# Patient Record
Sex: Female | Born: 1945 | Race: White | Hispanic: No | Marital: Married | Smoking: Never smoker
Health system: Southern US, Community
[De-identification: ages and names within clinical notes are randomized; demographics above are authoritative.]

## PROBLEM LIST (undated history)

## (undated) DIAGNOSIS — E669 Obesity, unspecified: Secondary | ICD-10-CM

## (undated) DIAGNOSIS — N189 Chronic kidney disease, unspecified: Secondary | ICD-10-CM

## (undated) DIAGNOSIS — I10 Essential (primary) hypertension: Secondary | ICD-10-CM

## (undated) DIAGNOSIS — G4730 Sleep apnea, unspecified: Secondary | ICD-10-CM

## (undated) HISTORY — PX: APPENDECTOMY: SHX54

## (undated) HISTORY — DX: Chronic kidney disease, unspecified: N18.9

## (undated) HISTORY — PX: COSMETIC SURGERY: SHX468

## (undated) HISTORY — DX: Obesity, unspecified: E66.9

## (undated) HISTORY — PX: EYE SURGERY: SHX253

## (undated) HISTORY — PX: KNEE SURGERY: SHX244

## (undated) HISTORY — PX: CHOLECYSTECTOMY: SHX55

## (undated) HISTORY — DX: Sleep apnea, unspecified: G47.30

---

## 1998-09-17 ENCOUNTER — Other Ambulatory Visit: Admission: RE | Admit: 1998-09-17 | Discharge: 1998-09-17 | Payer: Self-pay | Admitting: Obstetrics and Gynecology

## 1998-10-06 ENCOUNTER — Encounter: Admission: RE | Admit: 1998-10-06 | Discharge: 1999-01-04 | Payer: Self-pay

## 1999-01-25 ENCOUNTER — Other Ambulatory Visit: Admission: RE | Admit: 1999-01-25 | Discharge: 1999-01-25 | Payer: Self-pay | Admitting: Obstetrics and Gynecology

## 2000-01-28 ENCOUNTER — Other Ambulatory Visit: Admission: RE | Admit: 2000-01-28 | Discharge: 2000-01-28 | Payer: Self-pay | Admitting: Obstetrics and Gynecology

## 2001-04-26 ENCOUNTER — Other Ambulatory Visit: Admission: RE | Admit: 2001-04-26 | Discharge: 2001-04-26 | Payer: Self-pay | Admitting: Obstetrics and Gynecology

## 2002-02-01 ENCOUNTER — Encounter: Payer: Self-pay | Admitting: Internal Medicine

## 2002-02-01 ENCOUNTER — Encounter: Admission: RE | Admit: 2002-02-01 | Discharge: 2002-02-01 | Payer: Self-pay | Admitting: Internal Medicine

## 2002-04-04 ENCOUNTER — Encounter: Payer: Self-pay | Admitting: General Surgery

## 2002-04-08 ENCOUNTER — Encounter: Payer: Self-pay | Admitting: General Surgery

## 2002-04-08 ENCOUNTER — Observation Stay (HOSPITAL_COMMUNITY): Admission: RE | Admit: 2002-04-08 | Discharge: 2002-04-09 | Payer: Self-pay | Admitting: General Surgery

## 2002-05-06 ENCOUNTER — Other Ambulatory Visit: Admission: RE | Admit: 2002-05-06 | Discharge: 2002-05-06 | Payer: Self-pay | Admitting: Obstetrics and Gynecology

## 2003-05-12 ENCOUNTER — Other Ambulatory Visit: Admission: RE | Admit: 2003-05-12 | Discharge: 2003-05-12 | Payer: Self-pay | Admitting: Obstetrics and Gynecology

## 2004-08-05 ENCOUNTER — Other Ambulatory Visit: Admission: RE | Admit: 2004-08-05 | Discharge: 2004-08-05 | Payer: Self-pay | Admitting: Obstetrics and Gynecology

## 2005-03-25 ENCOUNTER — Encounter: Admission: RE | Admit: 2005-03-25 | Discharge: 2005-04-20 | Payer: Self-pay | Admitting: Internal Medicine

## 2005-08-30 ENCOUNTER — Other Ambulatory Visit: Admission: RE | Admit: 2005-08-30 | Discharge: 2005-08-30 | Payer: Self-pay | Admitting: Obstetrics and Gynecology

## 2006-01-23 ENCOUNTER — Inpatient Hospital Stay (HOSPITAL_COMMUNITY): Admission: RE | Admit: 2006-01-23 | Discharge: 2006-01-27 | Payer: Self-pay | Admitting: Orthopedic Surgery

## 2006-01-24 ENCOUNTER — Ambulatory Visit: Payer: Self-pay | Admitting: Physical Medicine & Rehabilitation

## 2006-01-27 ENCOUNTER — Inpatient Hospital Stay (HOSPITAL_COMMUNITY)
Admission: RE | Admit: 2006-01-27 | Discharge: 2006-02-02 | Payer: Self-pay | Admitting: Physical Medicine & Rehabilitation

## 2006-01-27 ENCOUNTER — Ambulatory Visit: Payer: Self-pay | Admitting: Physical Medicine & Rehabilitation

## 2006-10-12 ENCOUNTER — Other Ambulatory Visit: Admission: RE | Admit: 2006-10-12 | Discharge: 2006-10-12 | Payer: Self-pay | Admitting: Obstetrics and Gynecology

## 2007-03-12 IMAGING — CR DG CHEST 2V
2 series · 2 of 2 positions shown · non-contrast
Comparison: None.

CLINICAL DATA: Preop radiograph.  Osteoarthritis both knees. 
 CHEST - 2 VIEW:

[w chest pa]
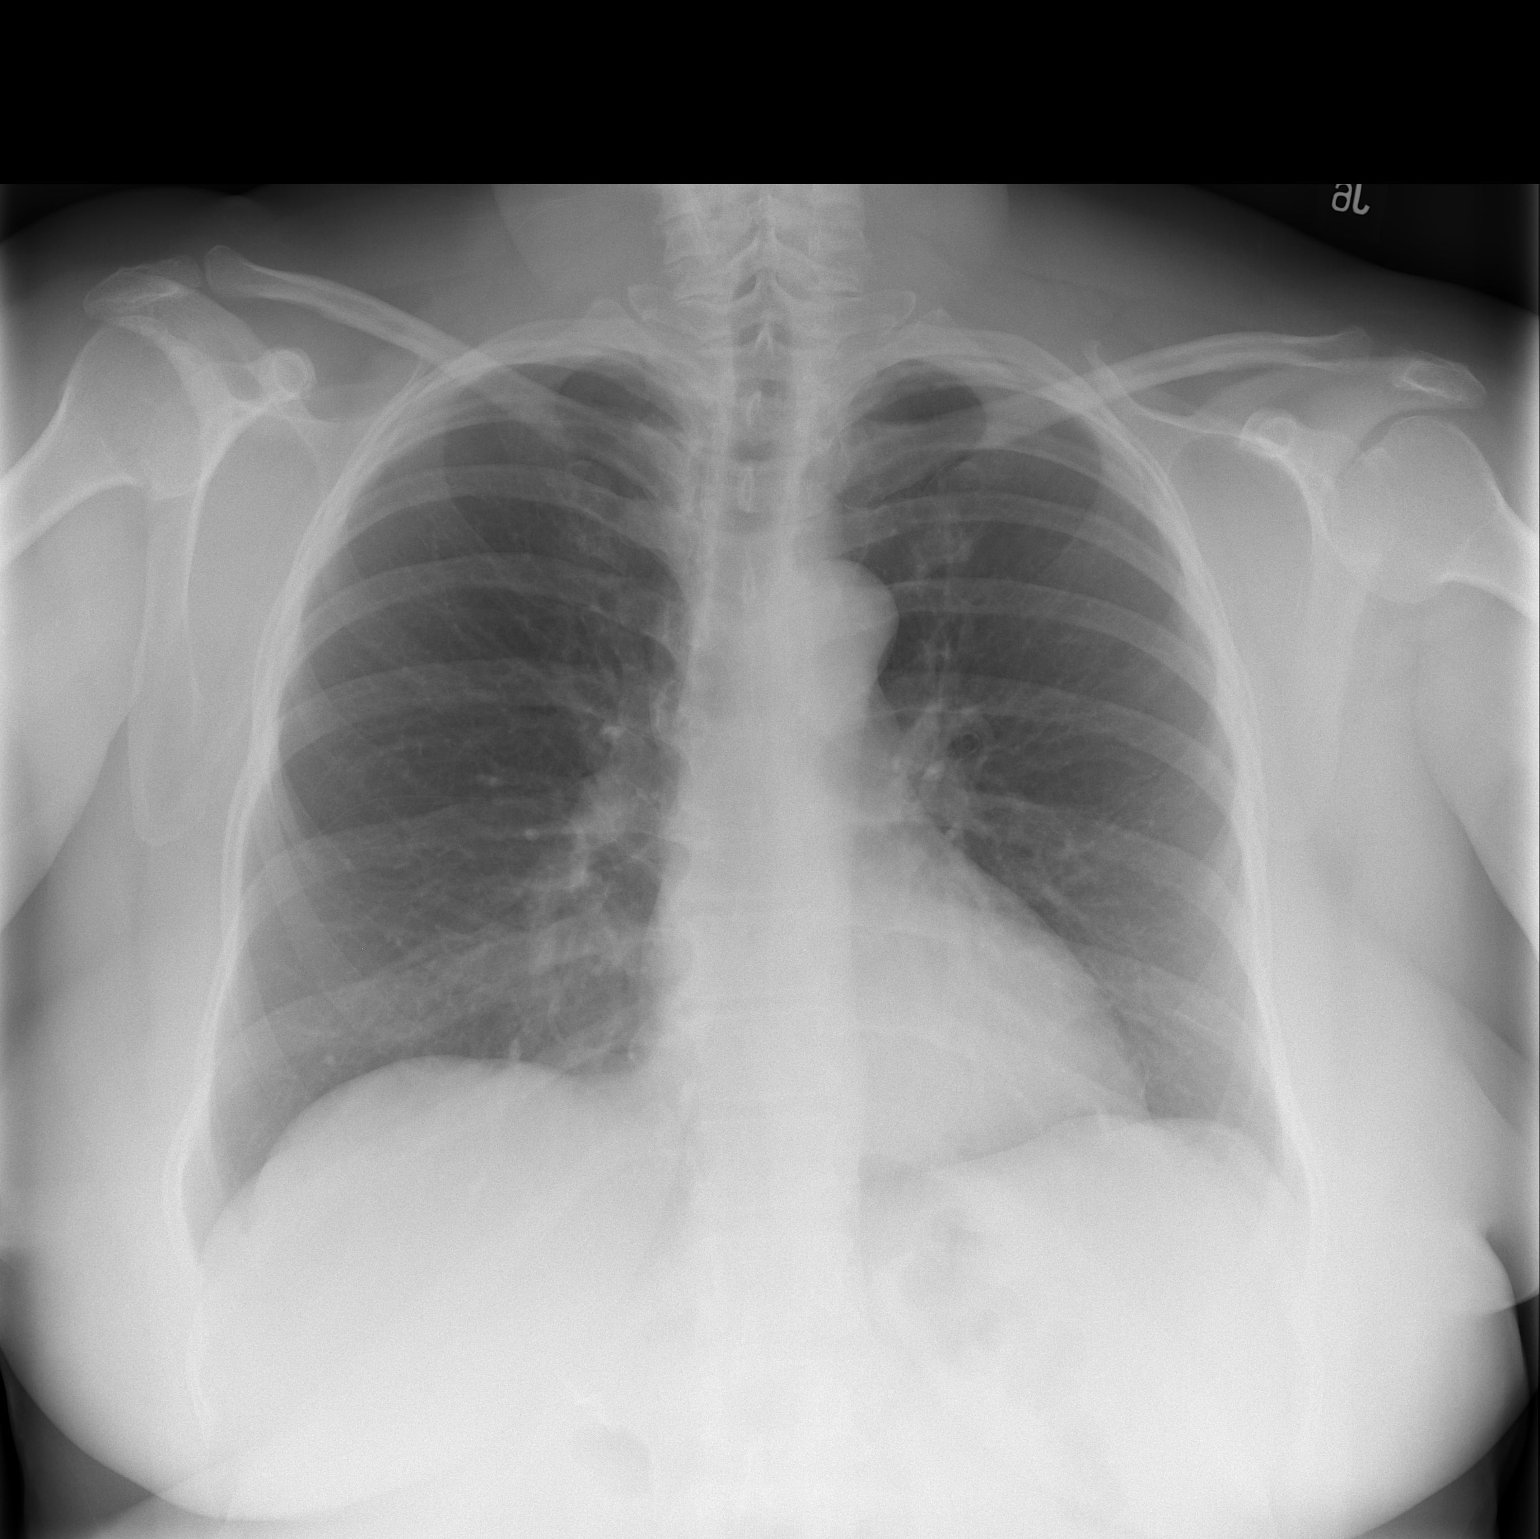

[w chest lat]
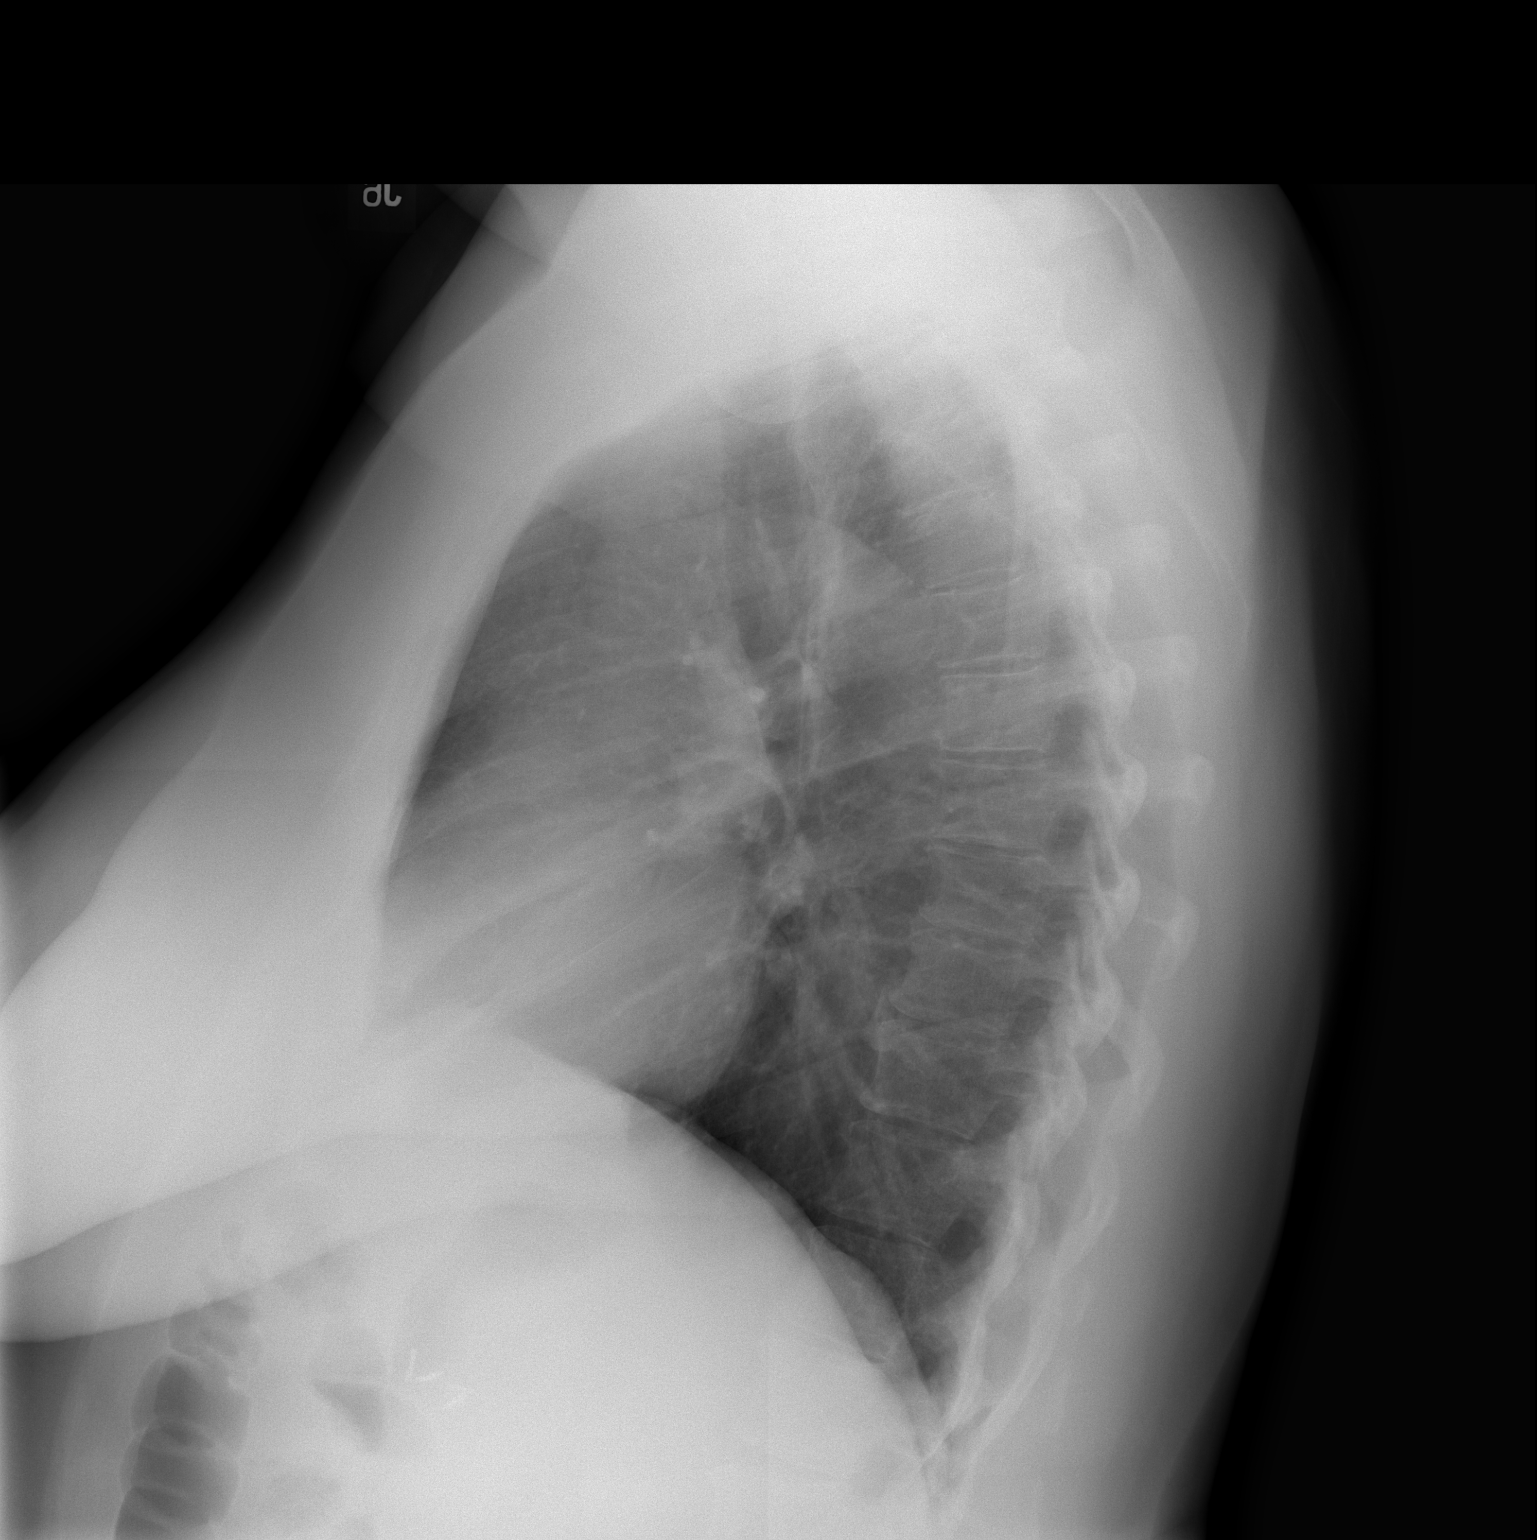

[2 of 2 positions shown; findings below may reference images not displayed]

FINDINGS: The heart size is normal.  There are no effusions or edema.
IMPRESSION: No active cardiopulmonary disease.

## 2008-02-19 ENCOUNTER — Other Ambulatory Visit: Admission: RE | Admit: 2008-02-19 | Discharge: 2008-02-19 | Payer: Self-pay | Admitting: Obstetrics and Gynecology

## 2008-10-14 ENCOUNTER — Ambulatory Visit: Payer: Self-pay | Admitting: Family Medicine

## 2008-10-28 ENCOUNTER — Ambulatory Visit: Payer: Self-pay | Admitting: Family Medicine

## 2008-11-04 ENCOUNTER — Ambulatory Visit: Payer: Self-pay | Admitting: Family Medicine

## 2008-11-11 ENCOUNTER — Ambulatory Visit: Payer: Self-pay | Admitting: Family Medicine

## 2008-12-02 ENCOUNTER — Ambulatory Visit: Payer: Self-pay | Admitting: Family Medicine

## 2008-12-09 ENCOUNTER — Ambulatory Visit: Payer: Self-pay | Admitting: Family Medicine

## 2008-12-16 ENCOUNTER — Ambulatory Visit: Payer: Self-pay | Admitting: Family Medicine

## 2008-12-23 ENCOUNTER — Ambulatory Visit: Payer: Self-pay | Admitting: Family Medicine

## 2009-01-06 ENCOUNTER — Ambulatory Visit: Payer: Self-pay | Admitting: Family Medicine

## 2009-02-03 ENCOUNTER — Ambulatory Visit: Payer: Self-pay | Admitting: Family Medicine

## 2009-02-10 ENCOUNTER — Ambulatory Visit: Payer: Self-pay | Admitting: Family Medicine

## 2009-02-23 ENCOUNTER — Other Ambulatory Visit: Admission: RE | Admit: 2009-02-23 | Discharge: 2009-02-23 | Payer: Self-pay | Admitting: Obstetrics and Gynecology

## 2009-03-03 ENCOUNTER — Ambulatory Visit: Payer: Self-pay | Admitting: Family Medicine

## 2010-08-25 ENCOUNTER — Other Ambulatory Visit: Admission: RE | Admit: 2010-08-25 | Discharge: 2010-08-25 | Payer: Self-pay | Admitting: Obstetrics and Gynecology

## 2011-04-15 NOTE — H&P (Signed)
NAMEJAYDI, [name]               ACCOUNT NO.:  [id number]   MEDICAL RECORD NO.:  [id number]          PATIENT TYPE:  INP   LOCATION:  B147                         FACILITY:  [facility]   PHYSICIAN:  Gaynelle Arabian, M.D.    DATE OF BIRTH:  04-Aug-1946   DATE OF ADMISSION:  01/23/2006  DATE OF DISCHARGE:                                HISTORY & PHYSICAL   CHIEF COMPLAINT:  Bilateral knee pain.   HISTORY OF PRESENT ILLNESS:  The patient is a 65 year old female who has  been seen by Dr. Gaynelle Arabian for ongoing bilateral knee pain.  Her knees  have reached the point where she is having pain with them just about all the  time now. 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  She is at  a point now where she would like to get her knees replaced.  She is seen in  the office where x-rays show bone on bone, actually worse on the right than  the left.  She does have end-stage arthritis and she would like to have both  knees done at the same time.  The advantages and disadvantages of doing  this, especially with more difficult rehabilitation have been discussed with  her.  From a medical standpoint she is a good candidate.  She has been seen  by Dr. Shelia Media preoperatively who felt there was no medical contraindications  to her surgery.  Her hypertension is under good control.  She is  subsequently admitted to the hospital for the procedure.   ALLERGIES:  PENICILLIN, SULFA, TETRACYCLINE, ERYTHROMYCIN, PSEUDOEPHEDRINE.   CURRENT MEDICATIONS:  1.  Meridia 15 mg p.o. daily.  2.  Micardis 80/12.5.  3.  Mobic 7.5.  4.  Lumigan eye drops.   PAST MEDICAL HISTORY:  Hypertension, postmenopausal.   PAST SURGICAL HISTORY:  Appendectomy, tonsillectomy, adenoidectomy, two  natural deliveries with episiotomy repairs, cholecystectomy, bilateral  ocular lens implants, bilateral blepharoplasties.   SOCIAL HISTORY:  The patient is married, nonsmoker, rare intake of alcohol.  Has two children.   Husband and daughter will be assisting with her care  after surgery.   FAMILY HISTORY:  Father deceased with history of hypertension and diabetes.  Gastrointestinal and breast cancer run in the family, also arthritis.   REVIEW OF SYSTEMS:  GENERAL:  No fevers, chills, night sweats. NEUROLOGICAL:  No seizures, syncope or paralysis.  RESPIRATORY:  No shortness of breath,  productive cough or hemoptysis. CARDIOVASCULAR:  No chest pain, angina or  orthopnea. GASTROINTESTINAL: No nausea, vomiting, diarrhea or constipation.  GENITOURINARY:  No dysuria, hematuria or discharge.  MUSCULOSKELETAL:  Bilateral knees above.   PHYSICAL EXAMINATION:  VITAL SIGNS:  Pulse 88, respirations 16, blood  pressure 112/62.  GENERAL APPEARANCE:  65 year old white female, well-nourished, well-  developed, slightly overweight in no acute distress.  She is alert and  oriented, cooperative.  HEENT:  Normocephalic, atraumatic. Pupils equal, round, reactive. Oropharynx  clear. Extraocular movements intact.  NECK:  Supple.  CHEST:  Clear anterior chest, no rhonchi, rubs, rales, wheezing.  HEART:  Regular rate and rhythm with no murmurs.  S1, S2  normal.  ABDOMEN:  Soft, round, bowel sounds present, nontender.  RECTAL, BREASTS, GENITOURINARY:  Not done, not pertinent to present illness.  EXTREMITIES:  Right knee shows range of motion of 10 to 115 degrees,  moderate crepitus noted with varus malalignment deformity.  Left knee shows  also range of motion of 10 to 115 degrees, moderate crepitus noted with  varus malalignment deformity.   IMPRESSION:  1.  Osteoarthritis bilateral knees.  2.  Hypertension.  3.  Postmenopausal.   PLAN:  Patient admitted to [facility] to undergo bilateral total  knee replacement arthroplasties.  Surgery will be performed by Dr. Gaynelle Arabian.  Dr. Shelia Media has seen the patient preoperatively and feels the  patient has no contraindications for upcoming surgery.       Alexzandrew L. Dara Lords, P.A.      Gaynelle Arabian, M.D.  Electronically Signed    ALP/MEDQ  D:  01/22/2006  T:  01/23/2006  Job:  [id number]   cc:   Lynnell Chad. Shelia Media, M.D.  Fax: [phone]

## 2011-04-15 NOTE — H&P (Signed)
[name], [name]               ACCOUNT NO.:  [id number]   MEDICAL RECORD NO.:  [id number]          PATIENT TYPE:  IPS   LOCATION:  6222                         FACILITY:  [facility]   PHYSICIAN:  Charlett Blake, M.D.DATE OF BIRTH:  January 04, 1946   DATE OF ADMISSION:  01/27/2006  DATE OF DISCHARGE:                                HISTORY & PHYSICAL   REASON FOR ADMISSION:  Bilateral knee osteoarthritis status post bilateral  total knee replacement with decreased self care and mobility.   HISTORY:  A 65 year old female with history of hypertension, bilateral knee  pain, severe end-stage osteoarthritis, elected to undergo bilateral total  knee angioplasty January 23, 2006, per Dr. Gaynelle Arabian.  Postoperatively  weightbearing as tolerated, placed on Coumadin for DVT prophylaxis.  INR 1.3  today.  Has received 2 units of procedure, risks, and benefits for an acute  blood loss anemia.  Has been on Dilaudid PCA for pain control which has been  discontinued today.  Has had adequate pain control thus far with p.o.  analgesics.   REVIEW OF SYSTEMS:  Positive for constipation; however, had her first bowel  movement today. She has urinary frequency but no urgency or burning. She has  pain to the bilateral knees.   PAST MEDICAL HISTORY:  Significant for:  1.  Osteoarthritis.  2.  Hypertension.   PAST SURGICAL HISTORY:  1.  Appendectomy.  2.  T&A.  3.  Cholecystectomy.  4.  Blepharoplasty.  5.  Excision of cataracts.   FAMILY HISTORY:  Positive for diabetes mellitus and cancer.   SOCIAL HISTORY:  Married.  Working prior to admission.  Three-level home,  three steps to enter. No tobacco, rare ETOH.   FUNCTIONAL HISTORY:  Driving.  Use of wheelchair for long distances.   CURRENT FUNCTIONAL STATUS:  Impaired mobility and self care.   MEDICATIONS AT HOME:  1.  Mobic 7.5 p.o. daily.  2.  Meridia 15 mg p.o. q.a.m.  3.  Lumigan 0.03% OU nightly.  4.  Micardis/hydrochlorothiazide  80/12.5 p.o. daily.   ALLERGIES:  ERYTHROMYCIN, TETRACYCLINE, CIPRO, SULFA, SUDAFED.   Last hemoglobin on March 1 was 10.4.  Last white count 10.2.   PHYSICAL EXAMINATION:  VITAL SIGNS:  Blood pressure 159/84, pulse 102,  temperature 98.3, respirations 20.  GENERAL: Morbidly obese female in no acute distress.  Mood and affect  appropriate but drowsy.  HEENT:  Her eyes are anicteric, not injected.  External ENT normal.  NECK: Supple without adenopathy.  LUNGS: Clear to auscultation.  Respiratory effort is good.  HEART: Regular rate and rhythm, no rubs, murmurs, extra sounds.  ABDOMEN:  Positive bowel sounds, soft, nontender to palpation.  EXTREMITIES: No clubbing, cyanosis, or edema.  Her knee wounds are healing  well, no evidence of effusion, no erythema.  NEUROLOGIC:  Alert and oriented x3.  Mood and affect as above.  Motor  strength 5/5 bilateral deltoid, biceps, triceps, grip.  She is 1 at the hip  flexors. Quad is not tested secondary to knee immobilizer.  TA and gastroc  are 4/5 bilaterally.   IMPRESSION:  1.  Functional  deficits due to bilateral knee osteoarthritis status post      bilateral total knee replacement, postop day #4, weight bearing as      tolerated.  Start PT, OT at stair level.  PT will work on mobility.  OT      will work on ADLs.  2.  Pain management.  Monitor closely per rehab nursing. OxyContin added as      substitute for Dilaudid PCA.  3.  Deep vein thrombosis prophylaxis.  Nursing monitor, subcutaneous Lovenox      until Coumadin therapeutic.  4.  Hypertension, stable. Last creatinine 1.1.  Will restart Micardis given      blood pressure elevation. Monitor renal status, recheck blood work on      March 5.      Charlett Blake, M.D.  Electronically Signed     AEK/MEDQ  D:  01/27/2006  T:  01/27/2006  Job:  [id number]   cc:   Lynnell Chad. Shelia Media, M.D.  Fax: [phone]   Gaynelle Arabian, M.D.  Fax: [phone]

## 2011-04-15 NOTE — Discharge Summary (Signed)
[name], [name]               ACCOUNT NO.:  [id number]   MEDICAL RECORD NO.:  [id number]          PATIENT TYPE:  IPS   LOCATION:  9449                         FACILITY:  [facility]   PHYSICIAN:  Charlett Blake, M.D.DATE OF BIRTH:  May 12, 1946   DATE OF ADMISSION:  01/27/2006  DATE OF DISCHARGE:  02/02/2006                                 DISCHARGE SUMMARY   DISCHARGE DIAGNOSIS:  1.  Bilateral total knee replacement.  2.  Hypertension.  3.  Acute blood loss anemia.  4.  Glaucoma.   HISTORY OF PRESENT ILLNESS:  Ms. [name] is a 65 year old patient with  a history of hypertension and bilateral knee pain secondary to severe end  stage OA.  She elected to undergo bilateral total knee replacement February  26 by Dr. Wynelle Link.  Postoperatively, is weight-bearing as tolerated and on  Coumadin for DVT prophylaxis.  INR is subtherapeutic at 1.3 today.  The  patient has received 2 units of packed red blood cells for postop anemia.  She has been on Dilaudid PCA for pain control.  Currently, the patient is  noted to have impairment in mobility and self-care, rehab consulted for  further therapies.   PAST MEDICAL HISTORY:  Significant for appendectomy, T&A, cholecystectomy,  bilateral blepharoplasties, excision of cataracts with intraocular implants,  hypertension, and glaucoma.   ALLERGIES:  ERYTHROMYCIN, PENICILLIN, TETRACYCLINE, CIPRO, SULFA, SUDAFED.   FAMILY HISTORY:  Positive for diabetes and cancer.   SOCIAL HISTORY:  The patient is married, was independent in driving and  working prior to admission.  She used a wheelchair for longer distances out  of the house.  She lives in a three level home with three steps at entry.  She does not use any tobacco, uses alcohol rarely.   HOSPITAL COURSE:  Ms. [name] was admitted to rehab on January 27, 2006,  for inpatient therapies to consist of PT and OT daily.  As INR was  subtherapeutic, Coumadin was cross covered with Lovenox  until INR was  therapeutic.  Dilaudid had been discontinued, therefore, OxyContin CR was  added for more consistent pain relief.  The patient's hypertension was  monitored on a b.i.d. basis during her stay.  Blood pressures have shown  reasonable control ranging from 675 to 916 systolic.  The patient had follow  up CBC done past admission showing H&H of 10.4 and 29.7.  The patient was  maintained on iron supplement throughout her stay.  A check of electrolytes  past admission revealed sodium 138, potassium 4.3, chloride 98, CO2 29, BUN  20, creatinine 1.2, glucose 114.  LFTs showed total protein 6.1, albumin  2.6, AST 53, ALT 55.  The incisions are intact.  The patient's pain control  was reasonable with the use of OxyContin and p.r.n. use of Oxycodone.  CPM  was used to assist with range of motion in bilateral knees.  The patient's  bilateral knee range of motion continues to be limited to around 65 degrees.  CPM has been ordered for home use.  Currently, the patient is at independent  level for transfers, modified independent  for ambulating 150 feet with a  rolling walker, able to navigate three steps at modified independent level  and modified independent for car transfers.  The patient is independent for  upper body care, modified independent for lower body care, modified  independent for toileting and toileting hygiene.  The patient will continue  to receive further follow up home health therapies past discharge by  [facility].  The patient is to continue on Coumadin until February 20, 2006, to complete her DVT prophylaxis course.  The patient's INR therapeutic  at 2.3 at the time of discharge.  The patient is discharged on Coumadin 5 mg  daily.  The patient's husband to provide assistance as needed past  discharge.  On February 02, 2006, the patient is discharged to home.   DISCHARGE MEDICATIONS:  Coumadin 5 mg p.o. q.p.m., Micardis/HCTZ 2/12.5 per  day, ferrous sulfate 325 mg  b.i.d., Lumigan 0.03% one drop o.u. q.h.s.,  OxyContin CR 20 mg p.o. b.i.d. x one week, then once daily for one week  until gone, Oxycodone IR 5-10 mg q.4-6h. p.r.n. pain, Robaxin 500 mg p.o.  q.i.d. p.r.n. spasms, Senokot-S p.o. b.i.d.   DISCHARGE INSTRUCTIONS:  Activities:  As tolerated with the use of walker.  Diet is regular.  Wound care:  Keep area clean and dry, wash with  antibacterial soap and water.  Special instructions:  [facility] to  provide PT/OT/RN.  Protime checked March 9 with Coumadin to continue through  March 26.  The patient is to follow up with Dr. Wynelle Link for postop check in  the next 7-10 days.  Follow up with Dr. Shelia Media for routine check.  Follow up  with Dr. Letta Pate as needed.      Thornton Dales, P.A.      Charlett Blake, M.D.  Electronically Signed    PP/MEDQ  D:  02/02/2006  T:  02/03/2006  Job:  [id number]   cc:   Lynnell Chad. Shelia Media, M.D.  Fax: [phone]   Gaynelle Arabian, M.D.  Fax: [phone]

## 2011-04-15 NOTE — Op Note (Signed)
[name], [name]               ACCOUNT NO.:  [id number]   MEDICAL RECORD NO.:  [id number]          PATIENT TYPE:  INP   LOCATION:  X004                         FACILITY:  [facility]   PHYSICIAN:  Gaynelle Arabian, M.D.    DATE OF BIRTH:  05/17/1946   DATE OF PROCEDURE:  01/23/2006  DATE OF DISCHARGE:                                 OPERATIVE REPORT   PREOPERATIVE DIAGNOSIS:  Osteoarthritis bilateral knees.   POSTOPERATIVE DIAGNOSIS:  Osteoarthritis bilateral knees.   PROCEDURE:  Bilateral total knee arthroplasty.   SURGEON:  Gaynelle Arabian, M.D.   ASSISTANT:  Arlee Muslim, PA-C.   ANESTHESIA:  Combined spinal epidural   ESTIMATED BLOOD LOSS:  Minimal.   DRAINS:  Hemovac x1.   COMPLICATIONS:  None.   TOURNIQUET TIME:  45 minutes on the right side at 300 mmHg and 41 minutes on  the left side at 300 mmHg.   CONDITION:  Stable to recovery.   CLINICAL NOTE:  [name] is a 65 year old female with end-stage osteoarthritis  of both knees with intractable pain. She has failed nonoperative management  including injection and presents now for bilateral total knee arthroplasty.   PROCEDURE IN DETAIL:  After successful administration of combined spinal and  epidural anesthetic, a tourniquet was placed high on both thighs and both  lower extremities prepped and draped in the usual sterile fashion. The right  was more symptomatic than the left, so we started the right side first. The  right lower extremity was wrapped in Esmarch, knee flexed and the tourniquet  inflated to 300 mmHg. A standard midline incision was made with a 10 blade  through the subcutaneous tissue to the level of the extensor mechanism. A  fresh blade was used to make a medial parapatellar arthrotomy and the soft  tissue over the proximal medial tibia subperiosteally elevated to the joint  line with a knife and into the semimembranosus bursa with a Cobb elevator.  The soft tissue over the proximal lateral tibia was  elevated with attention  being paid to avoiding the patellar tendon on the tibial tubercle. The  patella was everted, knee flexed 90 degrees, ACL and PCL removed. A drill  was used to create a starting hole in the distal femur and canal was  thoroughly irrigated. A 5 degree right valgus alignment guide is placed and  referencing off the posterior condyles rotation is marked and the block  pinned to remove 10 mm off the distal femur. Distal femoral resection is  made with an oscillating saw. A sizing block is placed and a size 2.5 is the  most appropriate. Rotation is marked off the epicondylar axis and then the  size 2.5 cutting block is placed. The anterior, posterior and chamfer cuts  are then made.   The tibia is subluxed forward and the menisci removed. Extramedullary tibial  alignment guide is placed referencing proximally at the medial aspect of the  tibial tubercle and distally along the second metatarsal axis and tibial  crest. The block is pinned to remove 10 mm off the nondeficient lateral  side. Tibial resection is made  with an oscillating saw. A size 2.5 is the  most appropriate tibial component and the proximal tibia is prepared with  the modular drill and keel punch for a size 2.5. Femoral preparation is  completed with the intercondylar cut.   The size 2.5 mobile bearing tibial trial with a size 2.5 posterior  stabilized femoral trial and a 10 mm posterior stabilized rotating platform  insert trial are placed. With the 10, full extension is achieved with  excellent varus and valgus balance throughout full range of motion. The  patella was then everted and thickness measured to be 22 mm. Freehand  resection is taken to 13 mm, 35 template is placed, lug holes were drilled,  trial patella was placed and it tracks normally. Osteophytes are then  removed off the posterior femur with the trial in place. All trials are  removed and the cut bone surfaces prepared with pulsatile  lavage. The cement  is mixed and once ready for implantation the size 2.5 mobile bearing tibial  tray, size 2.5 posterior stabilized femur and 35 patella are cemented into  place and patella is held with a clamp. A trial 10 mm insert is placed, knee  held in full extension and all extruded cement removed. Once the cement is  fully hardened then the permanent 10 mm posterior stabilized rotating  platform insert is placed into the tibial tray. The wound was copiously  irrigated with saline solution and the extensor mechanism closed over a  Hemovac drain with interrupted #1 PDS. Flexion against gravity is 130  degrees. The tourniquets is released with a total tourniquet time of 45  minutes. The subcu is then closed with interrupted 2-0 Vicryl. We then  placed a moist sponge and loosely wrapped the knee in Esmarch while  addressing the opposite side.   The Esmarch is wrapped around the left lower extremity and then the knee  flexed and tourniquet inflated to 300 mmHg. A midline incision is made and a  fresh knife is used to make a medial parapatellar arthrotomy. The soft  tissue over the proximal medial tibia is subperiosteally elevated to the  joint line with a knife and into the semimembranosus bursa with a Cobb  elevator. ACL and PCL are removed. Similar to the other side, we used a  drill to create a starting hole in the distal femur. The canal was  thoroughly irrigated and a 5 degree left valgus alignment guide is placed.  10 mm is taken off the distal femur. Size 2.5 is the most appropriate size  again. Rotation is marked at the epicondylar axis and then the 2.5 cutting  block is placed to make the anterior, posterior and chamfer cuts.   The tibia was subluxed forward and menisci removed. The extramedullary  tibial alignment guide was placed. 10 mm were taken off the nondeficient lateral side. 2.5 is the most appropriate tibial component and then the  proximal tibia prepared with a  modular drill and keel punch for the size  2.5. Intercondylar cuts are made on the femoral side and 2.5 trial was  placed for the posterior stabilized femur and the mobile bearing tibia. A 10  mm posterior stabilized rotating platform insert trial is placed, knee held  in full extension and it has excellent varus and valgus balance throughout  full range of motion. We again did a 35 patella. Initial thickness was 22 mm  and we resected down to 13 mm. The osteophytes were removed off the  posterior  femur. All trials were removed and the cut bone surfaces prepared  with pulsatile lavage. The cement is mixed and once ready for implantation,  the size 2.5 mobile bearing tibia, size 2.5 posterior stabilized femur and  35 patella are cemented into place. The patella is held with a clamp. A  trial 10 mm insert is placed knee held in full extension and all extruded  cement removed. Once the cement was fully hardened then the permanent 10 mm  posterior stabilized rotating platform insert is placed into the tibial  tray. The wound was copiously irrigated with saline solution and closure  accomplished the same as on the other side. We then  used a subcuticular stitch of 4-0 Monocryl to complete the closure on each  side. The drains were hooked to suction, Steri-Strips and a bulky sterile  dressing applied on each side. Ice packs were placed and she was placed into  knee immobilizers, awakened and transported to recovery in stable condition.      Gaynelle Arabian, M.D.  Electronically Signed     FA/MEDQ  D:  01/23/2006  T:  01/24/2006  Job:  [id number]

## 2011-04-15 NOTE — Discharge Summary (Signed)
[name], [name]               ACCOUNT NO.:  [id number]   MEDICAL RECORD NO.:  [id number]          PATIENT TYPE:  INP   LOCATION:  1638                         FACILITY:  [facility]   PHYSICIAN:  Gaynelle Arabian, M.D.    DATE OF BIRTH:  18-Jan-1946   DATE OF ADMISSION:  01/23/2006  DATE OF DISCHARGE:  01/27/2006                                 DISCHARGE SUMMARY   ADMITTING DIAGNOSES:  1.  Osteoarthritis bilateral knees.  2.  Hypertension.  3.  Postmenopausal.   DISCHARGE DIAGNOSES:  1.  Osteoarthritis bilateral knees status post bilateral total knee      arthroplasties.  2.  Postoperative blood loss anemia.  3.  Status post transfusion without sequelae.  4.  Postoperative hyponatremia, improved.  5.  Hypertension.  6.  Postmenopausal.   PROCEDURE:  January 23, 2006 bilateral total knee arthroplasty.  Surgeon  Dr. Wynelle Link.  Assistant Arlee Muslim, P.A.-C.  Anesthesia spinal with  postoperative epidural.  Tourniquet time 45 minutes on the right, 41 minutes  on the left.   CONSULTS:  Rehabilitation services.   BRIEF HISTORY:  [name] is a 65 year old female with end-stage osteoarthritis  of both knees with intractable pain and failed conservative management and  nowpresents for total knee arthroplasties.   LABORATORY DATA:  Preoperative:  CBC:  Hemoglobin 12.9, hematocrit 38, white  cell count 7.6.  Postoperative hemoglobin 8.3.  Given blood.  Post  transfusion hemoglobin 11.  Last noted H&H 10.4/30.4.  PT/PTT preoperative  12.8 and 30, respectively with INR 0.9.  Serial pro times followed.  Last  noted PT/INR 16 and 1.3.  Chem panel on admission:  Elevated BUN and  creatinine of 37 and 1.4, respectively.  Serial BMETs were followed.  Sodium  dropped from 135 to 134, back up to 140.  BUN came down to 10.  Creatinine  went up to 1.5, then improved down to 1.1.  Preoperative UA:  Small  leukocyte esterase, rare epithelial cells, otherwise negative with the  exception of 0-2 white  cells.  Blood group type A+.  EKG on the chart dated  January 09, 2006:  Normal sinus rhythm, rate 89.  Confirmed by Dr. Shelia [name].   HOSPITAL COURSE:  Admitted [name] procedure well.  Later transferred to recovery room and the orthopedic floor.  Given spinal  epidural postoperative pain.  On day one she was having some discomfort but  it was tolerable.  Anesthesia regulated the epidural for better pain  control.  Anesthesia did allow IV PCA Dilaudid to be used on top of the  epidural.  Hemovac drains on the right and left knees were pulled without  difficulty.  Unfortunately, blood loss had occurred with the bilateral total  knees.  Her hemoglobin dropped down to 8.3.  She was given blood.  She also  had some borderline asymptomatic hypotension secondary to the anemia.  Given  fluids and blood hemoglobin improved to 11 on the second day.  Rehabilitation consult was called.  Patient was felt to be an appropriate  candidate for rehabilitation.  It was decided she would  be transferred at  which time bed became available.  On day two dressings were changed to both  knees.  Incision looked very good on both sides.  She started on Coumadin  and Lovenox once the epidural was out.  Started getting up with physical  therapy.  Was slow to progress with physical therapy.  Pressure improved by  day three.  She was still using the PCA.  Foley was removed.  Slowly  progressing.  Ambulating by 30 or 40 feet.  Pain was improving.  Been weaned  over to p.o. medications and by January 27, 2006 she was doing well, feeling  better.  Taking p.o. medications and was transferred over to rehabilitation.   DISCHARGE PLAN:  Patient transferred over to [facility].   DISCHARGE DIAGNOSES:  Please see above.   DISCHARGE MEDICATIONS:  Continue current medications as per the [facility].  Will  be sent over with the patient.   DIET:  Continue current diet.   ACTIVITY:  Weightbearing as  tolerated both left and right lower extremity.  Knee immobilizers for ambulation.  Weightbearing as tolerated.  Gait  training, ambulation, ADLs as per PT and OT.  Follow up two weeks from  surgery following the discharge from rehabilitation unit.   DISPOSITION:  [facility].   CONDITION ON DISCHARGE:  Improving.      Alexzandrew L. Dara Lords, P.A.      Gaynelle Arabian, M.D.  Electronically Signed    ALP/MEDQ  D:  03/15/2006  T:  03/16/2006  Job:  [id number]   cc:   [name]. [name], M.D.  Fax: [phone]   Rehab Services

## 2011-08-26 ENCOUNTER — Other Ambulatory Visit (HOSPITAL_COMMUNITY)
Admission: RE | Admit: 2011-08-26 | Discharge: 2011-08-26 | Disposition: A | Discharge status: Home / Self Care | Payer: MEDICARE | Source: Ambulatory Visit | Attending: Obstetrics and Gynecology | Admitting: Obstetrics and Gynecology

## 2011-08-26 ENCOUNTER — Other Ambulatory Visit: Payer: Self-pay | Admitting: Obstetrics and Gynecology

## 2011-08-26 DIAGNOSIS — Z01419 Encounter for gynecological examination (general) (routine) without abnormal findings: Secondary | ICD-10-CM | POA: Insufficient documentation

## 2011-11-13 ENCOUNTER — Encounter: Payer: Self-pay | Admitting: Adult Health

## 2011-11-13 ENCOUNTER — Emergency Department (HOSPITAL_COMMUNITY): Payer: MEDICARE

## 2011-11-13 ENCOUNTER — Emergency Department (HOSPITAL_COMMUNITY)
Admission: EM | Admit: 2011-11-13 | Discharge: 2011-11-13 | Disposition: A | Discharge status: Home / Self Care | Payer: MEDICARE | Attending: Emergency Medicine | Admitting: Emergency Medicine

## 2011-11-13 DIAGNOSIS — I10 Essential (primary) hypertension: Secondary | ICD-10-CM | POA: Insufficient documentation

## 2011-11-13 DIAGNOSIS — Z79899 Other long term (current) drug therapy: Secondary | ICD-10-CM | POA: Insufficient documentation

## 2011-11-13 DIAGNOSIS — M79609 Pain in unspecified limb: Secondary | ICD-10-CM

## 2011-11-13 DIAGNOSIS — M25569 Pain in unspecified knee: Secondary | ICD-10-CM | POA: Insufficient documentation

## 2011-11-13 DIAGNOSIS — I82409 Acute embolism and thrombosis of unspecified deep veins of unspecified lower extremity: Secondary | ICD-10-CM | POA: Insufficient documentation

## 2011-11-13 HISTORY — DX: Essential (primary) hypertension: I10

## 2011-11-13 LAB — PROTIME-INR: INR: 0.91 (ref 0.00–1.49)

## 2011-11-13 MED ORDER — ENOXAPARIN SODIUM 100 MG/ML ~~LOC~~ SOLN
100.0000 mg | SUBCUTANEOUS | Status: AC
  Administered 2011-11-13: 100 mg via SUBCUTANEOUS
  Filled 2011-11-13: qty 1

## 2011-11-13 MED ORDER — ENOXAPARIN SODIUM 100 MG/ML ~~LOC~~ SOLN: 50.0000 mg | Freq: Once | SUBCUTANEOUS | Status: DC

## 2011-11-13 MED ORDER — WARFARIN SODIUM 5 MG PO TABS
5.0000 mg | ORAL_TABLET | Freq: Once | ORAL | Status: AC
  Administered 2011-11-13: 5 mg via ORAL
  Filled 2011-11-13: qty 1

## 2011-11-13 MED ORDER — ENOXAPARIN SODIUM 100 MG/ML ~~LOC~~ SOLN: 100.0000 mg | Freq: Once | SUBCUTANEOUS | Status: DC

## 2011-11-13 MED ORDER — ENOXAPARIN SODIUM 100 MG/ML ~~LOC~~ SOLN: 100.0000 mg | Freq: Two times a day (BID) | SUBCUTANEOUS | Status: AC

## 2011-11-13 MED ORDER — WARFARIN SODIUM 5 MG PO TABS: 5.0000 mg | ORAL_TABLET | Freq: Every day | ORAL | Status: AC

## 2011-11-13 NOTE — Progress Notes (Signed)
PHARMACIST - PHYSICIAN COMMUNICATION CONCERNING: Pharmacy Care Issues Regarding Warfarin Labs  RECOMMENDATION (Action Taken): A baseline and daily protime for three days has been ordered to meet the [facility] Patient safety goal and comply with the current [place].   The Pharmacy will defer all warfarin dose order changes and follow up of lab results to the prescriber unless an additional order to initiate a "pharmacy Coumadin consult" is placed.  DESCRIPTION:  While hospitalized, to be in compliance with [facility] [place] Patient Safety Goals, all patients on warfarin must have a baseline and/or current protime prior to the administration of warfarin. Pharmacy has received your order for warfarin without these required laboratory assessments.

## 2011-11-13 NOTE — ED Provider Notes (Signed)
History     CSN: [id number] Arrival date & time: 11/13/2011 11:59 AM   First MD Initiated Contact with Patient 11/13/11 1234      Chief Complaint  Patient presents with  . Leg Pain   HPI Patient presents to the emergency room with complaint of right mid-calf tenderness. Patient also complaint of pain at her right knee with movement. Patient denies any other complaints including chest pain or shortness of breath. Patient denies any nausea, vomiting or fevers. Patient denies any ankle pain.   Past Medical History  Diagnosis Date  . Hypertension     Past Surgical History  Procedure Date  . Knee surgery     History reviewed. No pertinent family history.  History  Substance Use Topics  . Smoking status: Never Smoker   . Smokeless tobacco: Not on file  . Alcohol Use: Yes    OB History    Grav Para Term Preterm Abortions TAB SAB Ect Mult Living                  Review of Systems  Constitutional: Negative for fever, chills, diaphoresis and appetite change.  HENT: Negative for neck pain.   Eyes: Negative for photophobia and visual disturbance.  Respiratory: Negative for cough, chest tightness and shortness of breath.   Cardiovascular: Negative for chest pain.  Gastrointestinal: Negative for nausea, vomiting and abdominal pain.  Genitourinary: Negative for flank pain.  Musculoskeletal: Negative for back pain.  Skin: Negative for rash.  Neurological: Negative for weakness and numbness.  All other systems reviewed and are negative.    Allergies  Azithromycin; Erythromycin; Sulfa antibiotics; Antihistamines, diphenhydramine-type; Ciprofloxacin; Penicillins; and Tetracyclines & related  Home Medications   Current Outpatient Rx  Name Route Sig Dispense Refill  . BIMATOPROST 0.03 % OP SOLN Both Eyes Place 1 drop into both eyes at bedtime.      Marland Kitchen BRIMONIDINE TARTRATE-TIMOLOL 0.2-0.5 % OP SOLN Left Eye Place 1 drop into the left eye every 12 (twelve) hours.      .  ERGOCALCIFEROL 8000 UNIT/ML PO SOLN Oral Take 4,000 Units by mouth daily.      . TELMISARTAN-HCTZ 80-12.5 MG PO TABS Oral Take 0.5 tablets by mouth daily.      Marland Kitchen VITAMIN C 500 MG PO TABS Oral Take 250 mg by mouth 2 (two) times daily.        BP 140/54  Pulse 93  Temp(Src) 99.2 F (37.3 C) (Oral)  Resp 16  SpO2 98%  Physical Exam  Nursing note and vitals reviewed. Constitutional: She is oriented to person, place, and time. She appears well-developed and well-nourished.  Non-toxic appearance. No distress.       Vital signs stable  HENT:  Head: Normocephalic and atraumatic.  Eyes: EOM are normal. Pupils are equal, round, and reactive to light.  Neck: Normal range of motion. Neck supple.  Cardiovascular: Normal rate, regular rhythm, normal heart sounds and intact distal pulses.  Exam reveals no gallop and no friction rub.   No murmur heard. Pulmonary/Chest: Effort normal and breath sounds normal. No respiratory distress. She has no wheezes.  Abdominal: Soft. Bowel sounds are normal. There is no tenderness. There is no rebound and no guarding.  Musculoskeletal: Normal range of motion. She exhibits no edema and no tenderness.       Right knee pain to palpation. Right calf tenderness to palpation. Dorsalis pedis and tibialis posterior pulse intact. Full ROM. Normal strength 5/5 in right and left lower extremity.  Neurological: She is alert and oriented to person, place, and time. She exhibits normal muscle tone.       Gait normal. Sensation normal.  Skin: Skin is warm and dry. No rash noted. She is not diaphoretic.  Psychiatric: She has a normal mood and affect. Her behavior is normal. Judgment and thought content normal.    ED Course  Procedures (including critical care time)  Patient seen and evaluated.  VSS reviewed. . Nursing notes reviewed. Discussed with and seen with dr. Roxanne Mins attending physician. Initial testing ordered. Will monitor the patient closely. They agree with the  treatment plan and diagnosis.    Labs Reviewed - No data to display Dg Knee Complete 4 Views Right  11/13/2011  *RADIOLOGY REPORT*  Clinical Data: Leg pain.  RIGHT KNEE - COMPLETE 4+ VIEW  Comparison: None  Findings: The patient is status post prior right total knee arthroplasty.  There is no joint effusion identified.  No evidence for periprostatic fracture or dislocation.  IMPRESSION:  1.  No acute findings noted.  Original Report Authenticated By: Angelita Ingles, M.D.   Patient seen and re-evaluated. Resting comfortably. VSS stable. NAD. Patient notified of testing results. Stated agreement and understanding. Patient stated understanding to treatment plan and diagnosis. Doppler ultrasound performed to the right lower calf to r/o DVT due to tenderness. No swelling. No chest pain or shortness of breath. Low risk of DVT. No recent travel or surgeries.   3:14 PM pending doppler ultrasound, care taken over by Delos Haring, PA-C  MDM  Right leg pain   Benson Setting, PA 11/13/11 1516

## 2011-11-13 NOTE — ED Notes (Signed)
Pt advised of DVTs  x2. PA orders received

## 2011-11-13 NOTE — Progress Notes (Signed)
65 year old female felt something snap in her right calf when she woke up this morning. She denies any actual trauma. She thought it might have been just a cramp but it has not gotten better. Exam shows tenderness to palpation in the on the right. There is no calf swelling or do palpable. There is pain elicited with passive at the ankle, but no pain is elicited with plantar flexion against resistance. Venous Dopplers been ordered.

## 2011-11-13 NOTE — ED Provider Notes (Signed)
I have personally performed and participated in all the services and procedures documented herein. I have reviewed the findings with the patient.   Delora Fuel, MD 14/99/69 [id number]

## 2011-11-13 NOTE — Progress Notes (Signed)
*  PRELIMINARY RESULTS* Right lower extremity Doppler completed.  There is non occlusive, acute deep vein thrombosis noted in the right posterior tibial and peroneal veins.    Sharion Dove Renee 11/13/2011, 3:51 PM

## 2011-11-13 NOTE — ED Provider Notes (Signed)
*  PRELIMINARY RESULTS*  Right lower extremity Doppler completed. There is non occlusive, acute deep vein thrombosis noted in the right posterior tibial and peroneal veins.  [name]  11/13/2011, 3:51 PM       Pt positive for acute DVT. Am giving lovenox in ED and checking PT/INR. The patient weights 228 pounds, $RemoveBef'1mg'vcWBnyPgwq$ /kg appox $RemoveB'50mg'imRVUeHz$  of Lovenox needed. Will give patient prescription for medication and have her follow-up with her PCP for further management.  Linus Mako, [place] 11/19/11 [phone]

## 2011-11-13 NOTE — ED Notes (Signed)
Reports having a leg cramp this AM, straightened leg out and heard a pop, pain is right lower calf, no redness or edema. Painful to bear weight, pain is only with movement.

## 2011-11-13 NOTE — ED Notes (Signed)
Delay d/t medication wait from pharm called x2 states dispense in process

## 2011-11-22 NOTE — ED Provider Notes (Signed)
Medical screening examination/treatment/procedure(s) were performed by non-physician practitioner and as supervising physician I was immediately available for consultation/collaboration.  Virgel Manifold, MD 11/22/11 [phone]

## 2012-04-30 ENCOUNTER — Other Ambulatory Visit: Payer: Self-pay | Admitting: Internal Medicine

## 2012-04-30 DIAGNOSIS — I82409 Acute embolism and thrombosis of unspecified deep veins of unspecified lower extremity: Secondary | ICD-10-CM

## 2012-05-04 ENCOUNTER — Ambulatory Visit
Admission: RE | Admit: 2012-05-04 | Discharge: 2012-05-04 | Disposition: A | Discharge status: Home / Self Care | Payer: MEDICARE | Source: Ambulatory Visit | Attending: Internal Medicine | Admitting: Internal Medicine

## 2012-05-04 DIAGNOSIS — I82409 Acute embolism and thrombosis of unspecified deep veins of unspecified lower extremity: Secondary | ICD-10-CM

## 2012-07-26 ENCOUNTER — Encounter: Payer: Self-pay | Admitting: *Deleted

## 2012-07-26 ENCOUNTER — Encounter: Payer: MEDICARE | Attending: Internal Medicine | Admitting: *Deleted

## 2012-07-26 DIAGNOSIS — Z713 Dietary counseling and surveillance: Secondary | ICD-10-CM | POA: Insufficient documentation

## 2012-07-26 NOTE — Progress Notes (Signed)
  Medical Nutrition Therapy:  Appt start time: 1500 end time:  1600.   Assessment:  Primary concerns today: obesity.  [name] is scheduled for bariatric surgery in January and is here for weight loss education   MEDICATIONS: see list   DIETARY INTAKE:  Usual eating pattern includes 2 meals and 0 snacks per day.  Everyday foods include restaurants.  Avoided foods include none.    24-hr recall:  B ( AM): skips- may have on weekends.  unsweet tea  Snk ( AM): none  L ( PM): salad or sandwich with unsweet tea Snk ( PM): none D ( PM): eats out: carrabas, tripps, chilis- no dessert usually Snk ( PM): peanuts or almonds Beverages: unsweet tea  Usual physical activity: none  Estimated energy needs: 1400 calories 158 g carbohydrates 105 g protein 39 g fat  Progress Towards Goal(s):  In progress.   Nutritional Diagnosis:  [place]-3.3 Overweight/obesity As related to large portions, meal skipping, and limited physical activity.  As evidenced by BMI o f46.4.    Intervention:  Nutrition counseling provided.  [name] had a lot of questions about her bariatric surgery and an appropriate diet.  Referred to her bariatric dietitian for pre-op and post-op dietary recommendations.  Did give information on post-op vitamins.  Encouraged low-carb diet until January.  Encouraged 3 meals/day and to avoid meal skipping.  Discussed what food contain carbohydrates and encouraged increased lean protein and non-starchy vegetables  Handouts given during visit include:  My meal plan card  Bariatric supplments  Monitoring/Evaluation:  Dietary intake, exercise,  and body weight prn.

## 2012-07-26 NOTE — Patient Instructions (Signed)
Goals:  Eat 3 meals/day, Avoid meal skipping   Increase protein rich foods  Follow "Plate Method" for portion control  Limit carbohydrate1-2 servings/meal   Choose more whole grains, lean protein, low-fat dairy, and fruits/non-starchy vegetables.   Aim for >30 min of physical activity daily  Limit sugar-sweetened beverages and concentrated sweets

## 2012-08-31 ENCOUNTER — Other Ambulatory Visit: Payer: Self-pay | Admitting: Gastroenterology

## 2012-09-18 ENCOUNTER — Other Ambulatory Visit: Payer: Self-pay | Admitting: Obstetrics and Gynecology

## 2012-09-18 ENCOUNTER — Other Ambulatory Visit (HOSPITAL_COMMUNITY)
Admission: RE | Admit: 2012-09-18 | Discharge: 2012-09-18 | Disposition: A | Discharge status: Home / Self Care | Payer: MEDICARE | Source: Ambulatory Visit | Attending: Obstetrics and Gynecology | Admitting: Obstetrics and Gynecology

## 2012-09-18 DIAGNOSIS — Z01419 Encounter for gynecological examination (general) (routine) without abnormal findings: Secondary | ICD-10-CM | POA: Insufficient documentation

## 2012-12-30 IMAGING — CR DG KNEE COMPLETE 4+V*R*
4 series · 4 of 4 positions shown · non-contrast
Comparison: None

CLINICAL DATA: Leg pain.

RIGHT KNEE - COMPLETE 4+ VIEW

[t knee ap right]
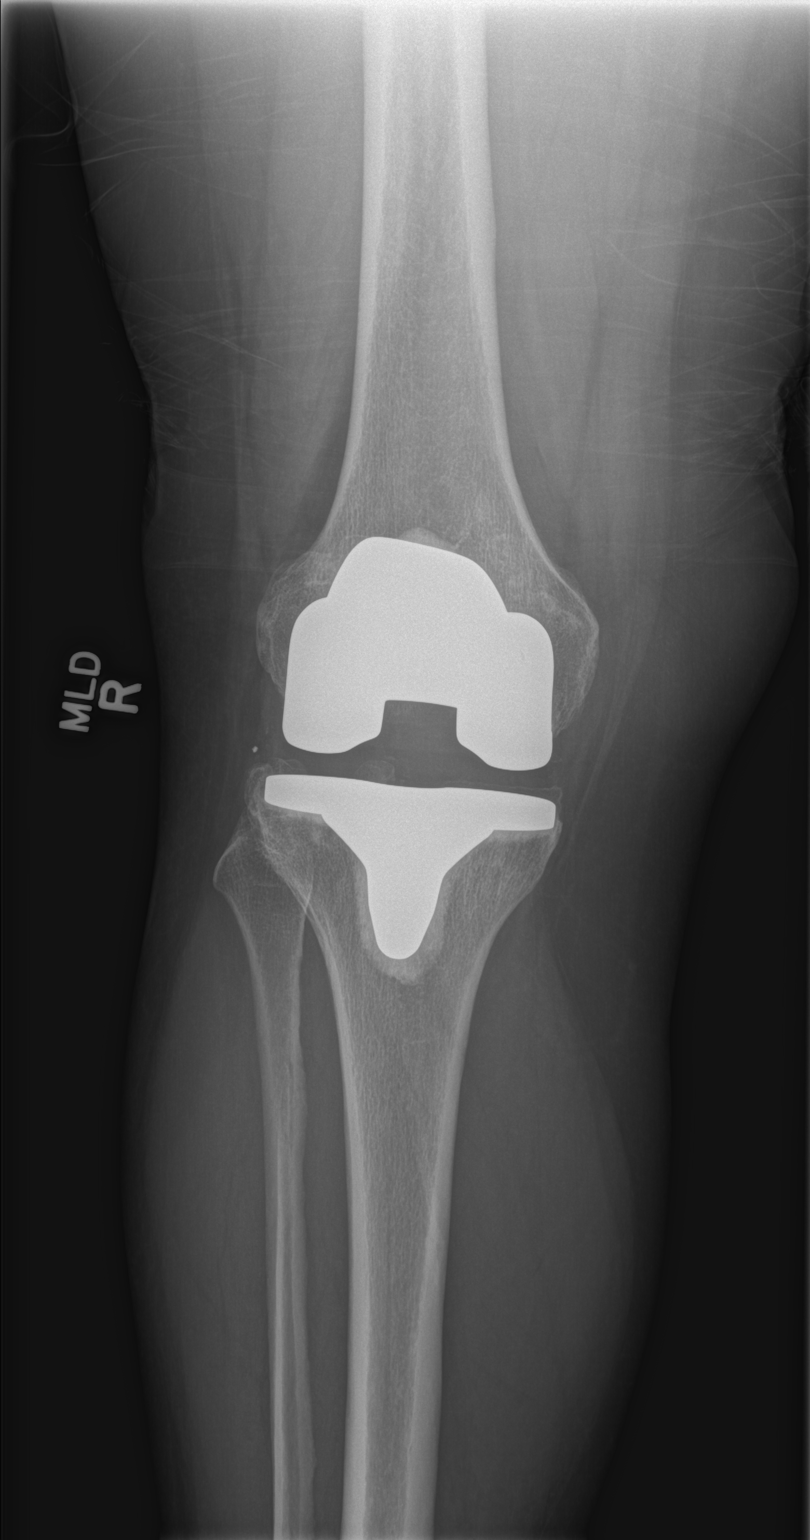

[t knee obl right (1 of 2)]
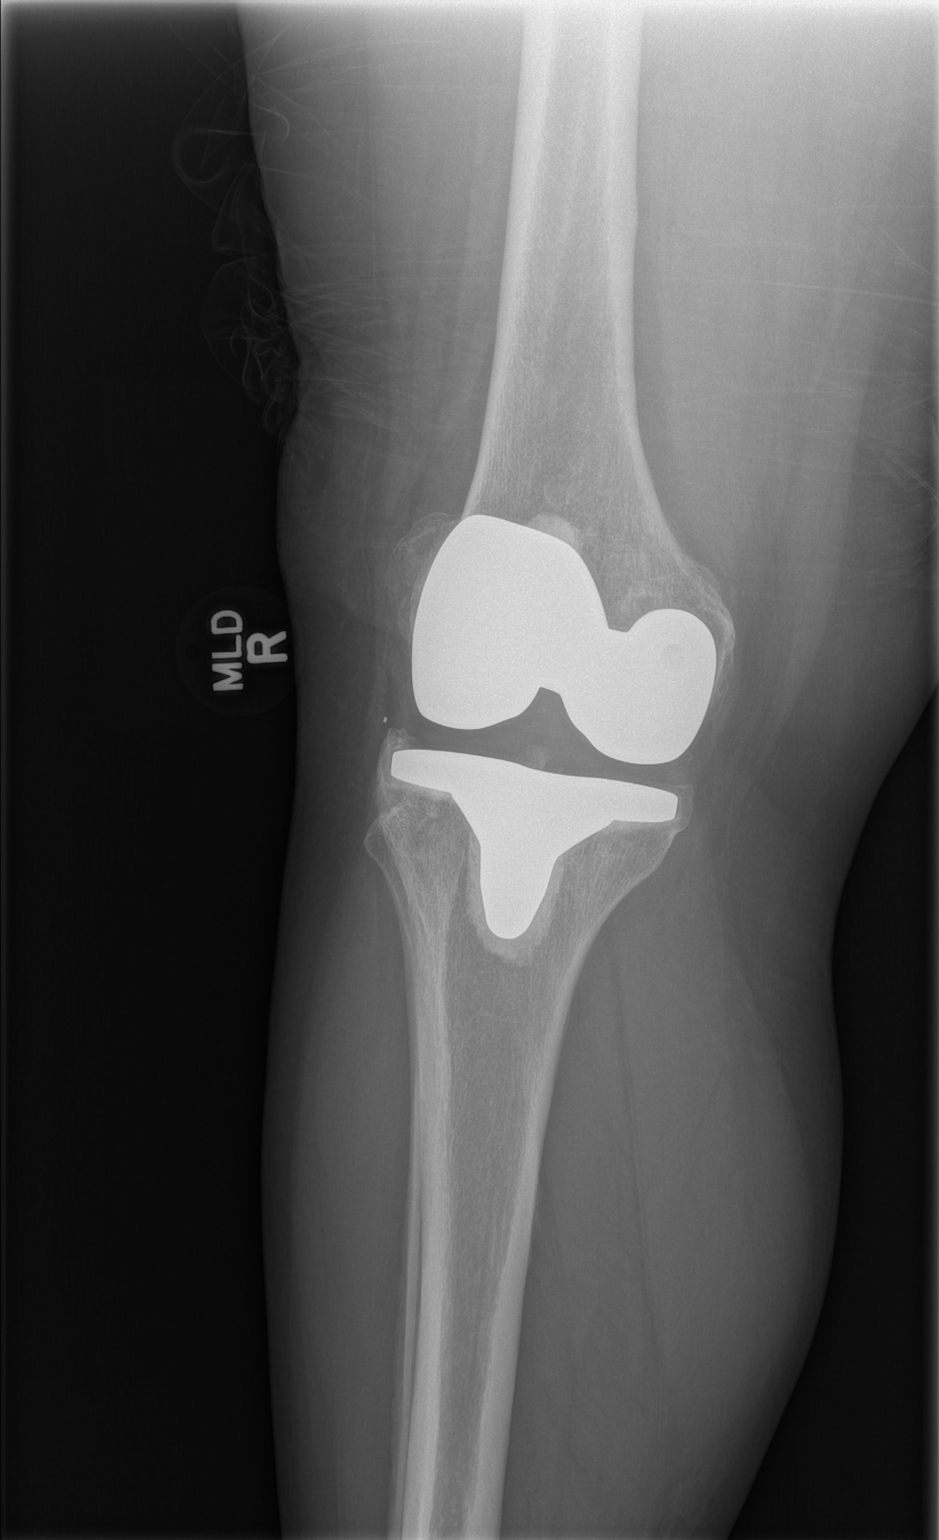

[t knee obl right (2 of 2)]
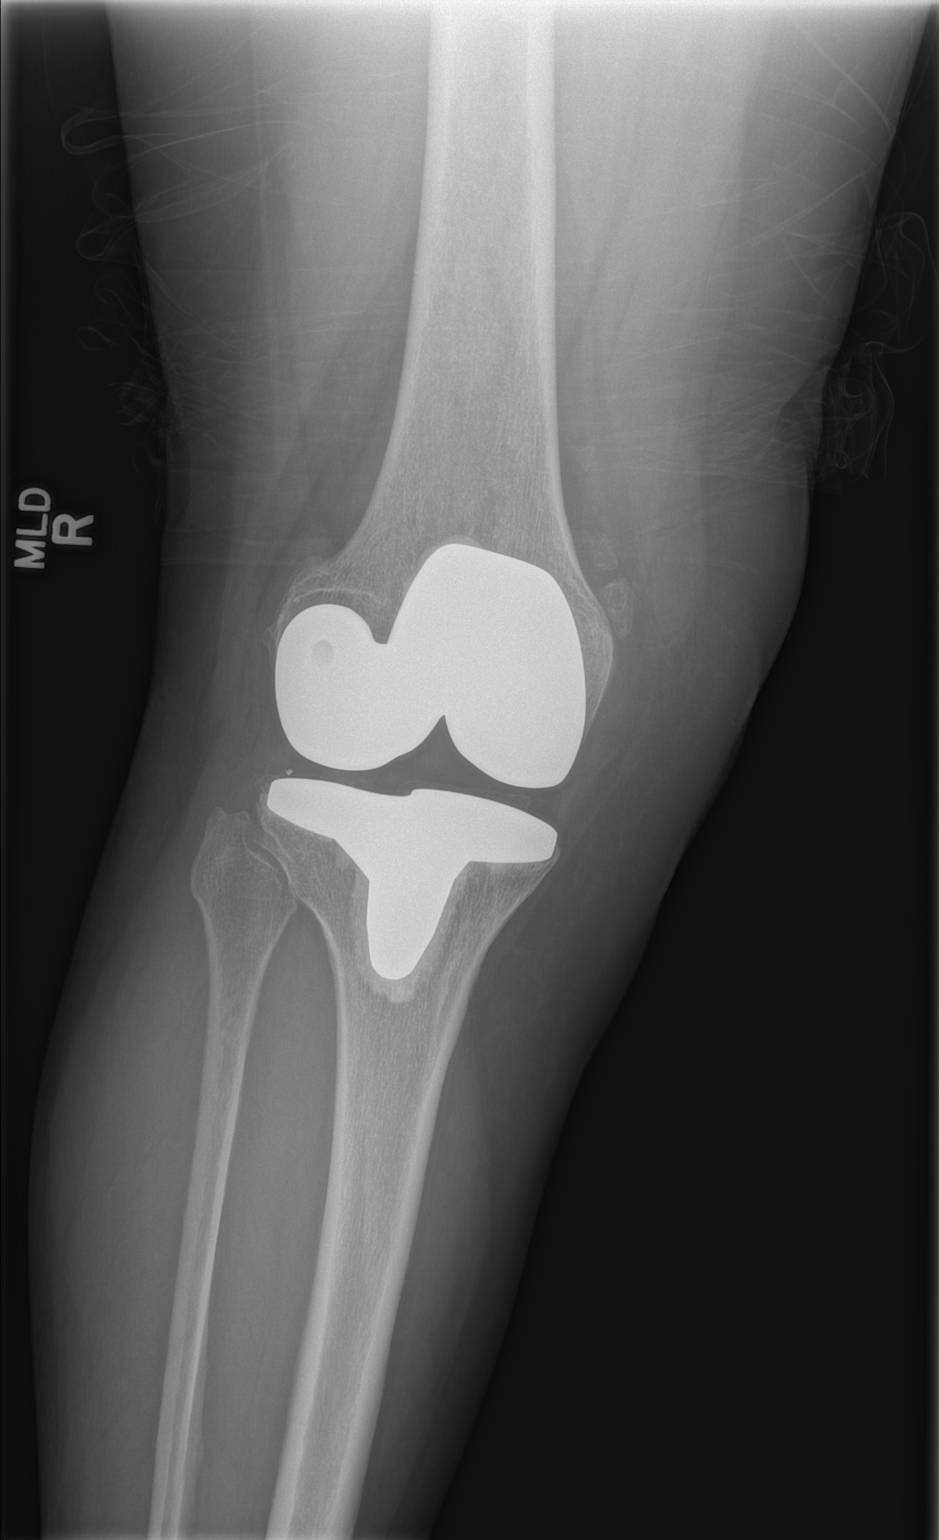

[t knee lat right]
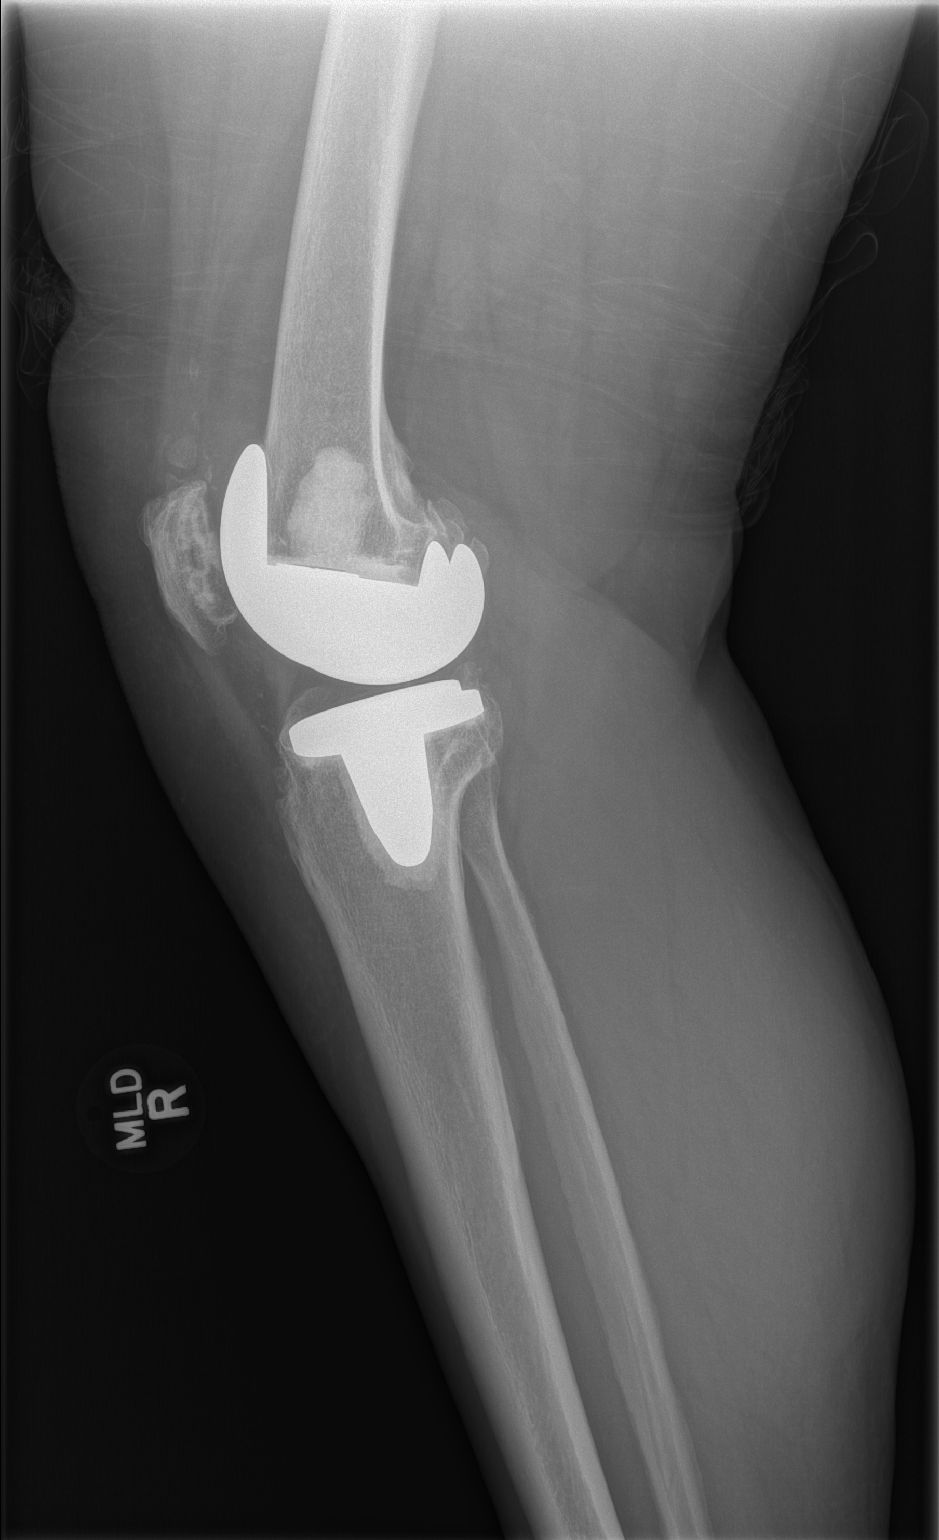

[4 of 4 positions shown; findings below may reference images not displayed]

FINDINGS: The patient is status post prior right total knee
arthroplasty.

There is no joint effusion identified.

No evidence for periprostatic fracture or dislocation.
IMPRESSION: 1.  No acute findings noted.

## 2015-03-03 ENCOUNTER — Other Ambulatory Visit: Payer: Self-pay | Admitting: Obstetrics and Gynecology

## 2015-03-03 ENCOUNTER — Other Ambulatory Visit (HOSPITAL_COMMUNITY)
Admission: RE | Admit: 2015-03-03 | Discharge: 2015-03-03 | Disposition: A | Discharge status: Home / Self Care | Payer: Medicare Other | Source: Ambulatory Visit | Attending: Obstetrics and Gynecology | Admitting: Obstetrics and Gynecology

## 2015-03-03 DIAGNOSIS — Z124 Encounter for screening for malignant neoplasm of cervix: Secondary | ICD-10-CM | POA: Insufficient documentation

## 2015-03-03 DIAGNOSIS — Z1151 Encounter for screening for human papillomavirus (HPV): Secondary | ICD-10-CM | POA: Insufficient documentation

## 2015-03-04 LAB — CYTOLOGY - PAP

## 2016-01-04 DIAGNOSIS — H401122 Primary open-angle glaucoma, left eye, moderate stage: Secondary | ICD-10-CM | POA: Diagnosis not present

## 2016-01-04 DIAGNOSIS — H401113 Primary open-angle glaucoma, right eye, severe stage: Secondary | ICD-10-CM | POA: Diagnosis not present

## 2016-01-04 DIAGNOSIS — Z961 Presence of intraocular lens: Secondary | ICD-10-CM | POA: Diagnosis not present

## 2016-02-25 DIAGNOSIS — Z6825 Body mass index (BMI) 25.0-25.9, adult: Secondary | ICD-10-CM | POA: Diagnosis not present

## 2016-02-25 DIAGNOSIS — Z48815 Encounter for surgical aftercare following surgery on the digestive system: Secondary | ICD-10-CM | POA: Diagnosis not present

## 2016-02-25 DIAGNOSIS — K912 Postsurgical malabsorption, not elsewhere classified: Secondary | ICD-10-CM | POA: Diagnosis not present

## 2016-02-25 DIAGNOSIS — I10 Essential (primary) hypertension: Secondary | ICD-10-CM | POA: Diagnosis not present

## 2016-02-25 DIAGNOSIS — E663 Overweight: Secondary | ICD-10-CM | POA: Diagnosis not present

## 2016-02-25 DIAGNOSIS — Z79899 Other long term (current) drug therapy: Secondary | ICD-10-CM | POA: Diagnosis not present

## 2016-02-25 DIAGNOSIS — Z713 Dietary counseling and surveillance: Secondary | ICD-10-CM | POA: Diagnosis not present

## 2016-02-25 DIAGNOSIS — Z5181 Encounter for therapeutic drug level monitoring: Secondary | ICD-10-CM | POA: Diagnosis not present

## 2016-02-25 DIAGNOSIS — Z9884 Bariatric surgery status: Secondary | ICD-10-CM | POA: Diagnosis not present

## 2016-04-07 DIAGNOSIS — Z961 Presence of intraocular lens: Secondary | ICD-10-CM | POA: Diagnosis not present

## 2016-04-07 DIAGNOSIS — H401133 Primary open-angle glaucoma, bilateral, severe stage: Secondary | ICD-10-CM | POA: Diagnosis not present

## 2016-05-17 DIAGNOSIS — H4010X3 Unspecified open-angle glaucoma, severe stage: Secondary | ICD-10-CM | POA: Diagnosis not present

## 2016-05-17 DIAGNOSIS — Z881 Allergy status to other antibiotic agents status: Secondary | ICD-10-CM | POA: Diagnosis not present

## 2016-05-17 DIAGNOSIS — M1990 Unspecified osteoarthritis, unspecified site: Secondary | ICD-10-CM | POA: Diagnosis not present

## 2016-05-17 DIAGNOSIS — Z888 Allergy status to other drugs, medicaments and biological substances status: Secondary | ICD-10-CM | POA: Diagnosis not present

## 2016-05-17 DIAGNOSIS — Z880 Allergy status to penicillin: Secondary | ICD-10-CM | POA: Diagnosis not present

## 2016-05-17 DIAGNOSIS — Z86718 Personal history of other venous thrombosis and embolism: Secondary | ICD-10-CM | POA: Diagnosis not present

## 2016-05-17 DIAGNOSIS — Z79899 Other long term (current) drug therapy: Secondary | ICD-10-CM | POA: Diagnosis not present

## 2016-05-17 DIAGNOSIS — H401123 Primary open-angle glaucoma, left eye, severe stage: Secondary | ICD-10-CM | POA: Diagnosis not present

## 2016-05-21 DIAGNOSIS — H401333 Pigmentary glaucoma, bilateral, severe stage: Secondary | ICD-10-CM | POA: Diagnosis not present

## 2016-06-08 DIAGNOSIS — D1801 Hemangioma of skin and subcutaneous tissue: Secondary | ICD-10-CM | POA: Diagnosis not present

## 2016-06-08 DIAGNOSIS — L918 Other hypertrophic disorders of the skin: Secondary | ICD-10-CM | POA: Diagnosis not present

## 2016-06-08 DIAGNOSIS — D2239 Melanocytic nevi of other parts of face: Secondary | ICD-10-CM | POA: Diagnosis not present

## 2016-06-13 DIAGNOSIS — H401133 Primary open-angle glaucoma, bilateral, severe stage: Secondary | ICD-10-CM | POA: Diagnosis not present

## 2016-06-13 DIAGNOSIS — Z9889 Other specified postprocedural states: Secondary | ICD-10-CM | POA: Diagnosis not present

## 2016-06-13 DIAGNOSIS — Z961 Presence of intraocular lens: Secondary | ICD-10-CM | POA: Diagnosis not present

## 2016-06-23 DIAGNOSIS — Z803 Family history of malignant neoplasm of breast: Secondary | ICD-10-CM | POA: Diagnosis not present

## 2016-06-23 DIAGNOSIS — Z1231 Encounter for screening mammogram for malignant neoplasm of breast: Secondary | ICD-10-CM | POA: Diagnosis not present

## 2016-08-02 DIAGNOSIS — L739 Follicular disorder, unspecified: Secondary | ICD-10-CM | POA: Diagnosis not present

## 2016-09-05 DIAGNOSIS — H401133 Primary open-angle glaucoma, bilateral, severe stage: Secondary | ICD-10-CM | POA: Diagnosis not present

## 2016-09-15 DIAGNOSIS — Z880 Allergy status to penicillin: Secondary | ICD-10-CM | POA: Diagnosis not present

## 2016-09-15 DIAGNOSIS — Z86718 Personal history of other venous thrombosis and embolism: Secondary | ICD-10-CM | POA: Diagnosis not present

## 2016-09-15 DIAGNOSIS — Z9841 Cataract extraction status, right eye: Secondary | ICD-10-CM | POA: Diagnosis not present

## 2016-09-15 DIAGNOSIS — H401133 Primary open-angle glaucoma, bilateral, severe stage: Secondary | ICD-10-CM | POA: Diagnosis not present

## 2016-09-15 DIAGNOSIS — H4010X3 Unspecified open-angle glaucoma, severe stage: Secondary | ICD-10-CM | POA: Diagnosis not present

## 2016-09-15 DIAGNOSIS — Z9842 Cataract extraction status, left eye: Secondary | ICD-10-CM | POA: Diagnosis not present

## 2016-09-15 DIAGNOSIS — Z961 Presence of intraocular lens: Secondary | ICD-10-CM | POA: Diagnosis not present

## 2016-09-15 DIAGNOSIS — Z882 Allergy status to sulfonamides status: Secondary | ICD-10-CM | POA: Diagnosis not present

## 2016-09-15 DIAGNOSIS — I10 Essential (primary) hypertension: Secondary | ICD-10-CM | POA: Diagnosis not present

## 2016-09-15 DIAGNOSIS — Z79899 Other long term (current) drug therapy: Secondary | ICD-10-CM | POA: Diagnosis not present

## 2016-09-15 DIAGNOSIS — H401123 Primary open-angle glaucoma, left eye, severe stage: Secondary | ICD-10-CM | POA: Diagnosis not present

## 2016-09-15 DIAGNOSIS — Z888 Allergy status to other drugs, medicaments and biological substances status: Secondary | ICD-10-CM | POA: Diagnosis not present

## 2016-10-26 DIAGNOSIS — Z961 Presence of intraocular lens: Secondary | ICD-10-CM | POA: Diagnosis not present

## 2016-10-26 DIAGNOSIS — H401133 Primary open-angle glaucoma, bilateral, severe stage: Secondary | ICD-10-CM | POA: Diagnosis not present

## 2016-12-09 DIAGNOSIS — I10 Essential (primary) hypertension: Secondary | ICD-10-CM | POA: Diagnosis not present

## 2016-12-09 DIAGNOSIS — E559 Vitamin D deficiency, unspecified: Secondary | ICD-10-CM | POA: Diagnosis not present

## 2016-12-09 DIAGNOSIS — M1990 Unspecified osteoarthritis, unspecified site: Secondary | ICD-10-CM | POA: Diagnosis not present

## 2016-12-09 DIAGNOSIS — Z0000 Encounter for general adult medical examination without abnormal findings: Secondary | ICD-10-CM | POA: Diagnosis not present

## 2016-12-12 DIAGNOSIS — M1990 Unspecified osteoarthritis, unspecified site: Secondary | ICD-10-CM | POA: Diagnosis not present

## 2016-12-12 DIAGNOSIS — L309 Dermatitis, unspecified: Secondary | ICD-10-CM | POA: Diagnosis not present

## 2016-12-12 DIAGNOSIS — E119 Type 2 diabetes mellitus without complications: Secondary | ICD-10-CM | POA: Diagnosis not present

## 2016-12-12 DIAGNOSIS — Z0001 Encounter for general adult medical examination with abnormal findings: Secondary | ICD-10-CM | POA: Diagnosis not present

## 2016-12-12 DIAGNOSIS — M47817 Spondylosis without myelopathy or radiculopathy, lumbosacral region: Secondary | ICD-10-CM | POA: Diagnosis not present

## 2016-12-12 DIAGNOSIS — M533 Sacrococcygeal disorders, not elsewhere classified: Secondary | ICD-10-CM | POA: Diagnosis not present

## 2016-12-12 DIAGNOSIS — Z780 Asymptomatic menopausal state: Secondary | ICD-10-CM | POA: Diagnosis not present

## 2016-12-22 DIAGNOSIS — M19041 Primary osteoarthritis, right hand: Secondary | ICD-10-CM | POA: Diagnosis not present

## 2016-12-22 DIAGNOSIS — M19042 Primary osteoarthritis, left hand: Secondary | ICD-10-CM | POA: Diagnosis not present

## 2016-12-22 DIAGNOSIS — M79643 Pain in unspecified hand: Secondary | ICD-10-CM | POA: Diagnosis not present

## 2016-12-22 DIAGNOSIS — M1990 Unspecified osteoarthritis, unspecified site: Secondary | ICD-10-CM | POA: Diagnosis not present

## 2016-12-22 DIAGNOSIS — M2550 Pain in unspecified joint: Secondary | ICD-10-CM | POA: Diagnosis not present

## 2016-12-22 DIAGNOSIS — M533 Sacrococcygeal disorders, not elsewhere classified: Secondary | ICD-10-CM | POA: Diagnosis not present

## 2016-12-26 DIAGNOSIS — Z961 Presence of intraocular lens: Secondary | ICD-10-CM | POA: Diagnosis not present

## 2016-12-26 DIAGNOSIS — H401133 Primary open-angle glaucoma, bilateral, severe stage: Secondary | ICD-10-CM | POA: Diagnosis not present

## 2017-01-23 DIAGNOSIS — H401133 Primary open-angle glaucoma, bilateral, severe stage: Secondary | ICD-10-CM | POA: Diagnosis not present

## 2017-02-14 DIAGNOSIS — H04123 Dry eye syndrome of bilateral lacrimal glands: Secondary | ICD-10-CM | POA: Diagnosis not present

## 2017-02-23 DIAGNOSIS — Z9884 Bariatric surgery status: Secondary | ICD-10-CM | POA: Diagnosis not present

## 2017-02-23 DIAGNOSIS — Z713 Dietary counseling and surveillance: Secondary | ICD-10-CM | POA: Diagnosis not present

## 2017-02-23 DIAGNOSIS — K912 Postsurgical malabsorption, not elsewhere classified: Secondary | ICD-10-CM | POA: Diagnosis not present

## 2017-03-13 DIAGNOSIS — Z961 Presence of intraocular lens: Secondary | ICD-10-CM | POA: Diagnosis not present

## 2017-03-13 DIAGNOSIS — H401133 Primary open-angle glaucoma, bilateral, severe stage: Secondary | ICD-10-CM | POA: Diagnosis not present

## 2017-03-27 DIAGNOSIS — Z01419 Encounter for gynecological examination (general) (routine) without abnormal findings: Secondary | ICD-10-CM | POA: Diagnosis not present

## 2017-03-27 DIAGNOSIS — Z780 Asymptomatic menopausal state: Secondary | ICD-10-CM | POA: Diagnosis not present

## 2017-03-28 DIAGNOSIS — M2550 Pain in unspecified joint: Secondary | ICD-10-CM | POA: Diagnosis not present

## 2017-03-28 DIAGNOSIS — M79643 Pain in unspecified hand: Secondary | ICD-10-CM | POA: Diagnosis not present

## 2017-03-28 DIAGNOSIS — M533 Sacrococcygeal disorders, not elsewhere classified: Secondary | ICD-10-CM | POA: Diagnosis not present

## 2017-03-28 DIAGNOSIS — M1990 Unspecified osteoarthritis, unspecified site: Secondary | ICD-10-CM | POA: Diagnosis not present

## 2017-05-15 DIAGNOSIS — H401133 Primary open-angle glaucoma, bilateral, severe stage: Secondary | ICD-10-CM | POA: Diagnosis not present

## 2017-05-15 DIAGNOSIS — Z961 Presence of intraocular lens: Secondary | ICD-10-CM | POA: Diagnosis not present

## 2017-06-26 DIAGNOSIS — Z1231 Encounter for screening mammogram for malignant neoplasm of breast: Secondary | ICD-10-CM | POA: Diagnosis not present

## 2017-06-26 DIAGNOSIS — Z803 Family history of malignant neoplasm of breast: Secondary | ICD-10-CM | POA: Diagnosis not present

## 2017-06-30 DIAGNOSIS — R922 Inconclusive mammogram: Secondary | ICD-10-CM | POA: Diagnosis not present

## 2017-07-24 DIAGNOSIS — E663 Overweight: Secondary | ICD-10-CM | POA: Diagnosis not present

## 2017-07-24 DIAGNOSIS — Z9884 Bariatric surgery status: Secondary | ICD-10-CM | POA: Diagnosis not present

## 2017-07-24 DIAGNOSIS — K912 Postsurgical malabsorption, not elsewhere classified: Secondary | ICD-10-CM | POA: Diagnosis not present

## 2017-08-29 DIAGNOSIS — L905 Scar conditions and fibrosis of skin: Secondary | ICD-10-CM | POA: Diagnosis not present

## 2017-08-29 DIAGNOSIS — D1801 Hemangioma of skin and subcutaneous tissue: Secondary | ICD-10-CM | POA: Diagnosis not present

## 2017-08-29 DIAGNOSIS — D2239 Melanocytic nevi of other parts of face: Secondary | ICD-10-CM | POA: Diagnosis not present

## 2017-08-29 DIAGNOSIS — D692 Other nonthrombocytopenic purpura: Secondary | ICD-10-CM | POA: Diagnosis not present

## 2017-09-15 DIAGNOSIS — Z713 Dietary counseling and surveillance: Secondary | ICD-10-CM | POA: Diagnosis not present

## 2017-09-15 DIAGNOSIS — Z9884 Bariatric surgery status: Secondary | ICD-10-CM | POA: Diagnosis not present

## 2017-09-18 DIAGNOSIS — H02883 Meibomian gland dysfunction of right eye, unspecified eyelid: Secondary | ICD-10-CM | POA: Diagnosis not present

## 2017-09-18 DIAGNOSIS — H02886 Meibomian gland dysfunction of left eye, unspecified eyelid: Secondary | ICD-10-CM | POA: Diagnosis not present

## 2017-09-18 DIAGNOSIS — H43813 Vitreous degeneration, bilateral: Secondary | ICD-10-CM | POA: Diagnosis not present

## 2017-09-18 DIAGNOSIS — H401133 Primary open-angle glaucoma, bilateral, severe stage: Secondary | ICD-10-CM | POA: Diagnosis not present

## 2017-12-18 DIAGNOSIS — E559 Vitamin D deficiency, unspecified: Secondary | ICD-10-CM | POA: Diagnosis not present

## 2017-12-18 DIAGNOSIS — Z0000 Encounter for general adult medical examination without abnormal findings: Secondary | ICD-10-CM | POA: Diagnosis not present

## 2017-12-21 DIAGNOSIS — Z1159 Encounter for screening for other viral diseases: Secondary | ICD-10-CM | POA: Diagnosis not present

## 2017-12-21 DIAGNOSIS — Z780 Asymptomatic menopausal state: Secondary | ICD-10-CM | POA: Diagnosis not present

## 2017-12-21 DIAGNOSIS — M1990 Unspecified osteoarthritis, unspecified site: Secondary | ICD-10-CM | POA: Diagnosis not present

## 2017-12-21 DIAGNOSIS — Z0001 Encounter for general adult medical examination with abnormal findings: Secondary | ICD-10-CM | POA: Diagnosis not present

## 2017-12-21 DIAGNOSIS — E875 Hyperkalemia: Secondary | ICD-10-CM | POA: Diagnosis not present

## 2018-01-22 DIAGNOSIS — H401133 Primary open-angle glaucoma, bilateral, severe stage: Secondary | ICD-10-CM | POA: Diagnosis not present

## 2018-03-26 DIAGNOSIS — M8588 Other specified disorders of bone density and structure, other site: Secondary | ICD-10-CM | POA: Diagnosis not present

## 2018-03-26 DIAGNOSIS — Z780 Asymptomatic menopausal state: Secondary | ICD-10-CM | POA: Diagnosis not present

## 2018-06-20 DIAGNOSIS — H02883 Meibomian gland dysfunction of right eye, unspecified eyelid: Secondary | ICD-10-CM | POA: Diagnosis not present

## 2018-06-20 DIAGNOSIS — H02886 Meibomian gland dysfunction of left eye, unspecified eyelid: Secondary | ICD-10-CM | POA: Diagnosis not present

## 2018-06-20 DIAGNOSIS — Z961 Presence of intraocular lens: Secondary | ICD-10-CM | POA: Diagnosis not present

## 2018-06-20 DIAGNOSIS — H401133 Primary open-angle glaucoma, bilateral, severe stage: Secondary | ICD-10-CM | POA: Diagnosis not present

## 2018-06-27 DIAGNOSIS — Z1231 Encounter for screening mammogram for malignant neoplasm of breast: Secondary | ICD-10-CM | POA: Diagnosis not present

## 2018-10-08 DIAGNOSIS — D2261 Melanocytic nevi of right upper limb, including shoulder: Secondary | ICD-10-CM | POA: Diagnosis not present

## 2018-10-08 DIAGNOSIS — D692 Other nonthrombocytopenic purpura: Secondary | ICD-10-CM | POA: Diagnosis not present

## 2018-10-08 DIAGNOSIS — D1801 Hemangioma of skin and subcutaneous tissue: Secondary | ICD-10-CM | POA: Diagnosis not present

## 2018-10-08 DIAGNOSIS — L853 Xerosis cutis: Secondary | ICD-10-CM | POA: Diagnosis not present

## 2018-11-02 DIAGNOSIS — H401133 Primary open-angle glaucoma, bilateral, severe stage: Secondary | ICD-10-CM | POA: Diagnosis not present

## 2018-12-03 DIAGNOSIS — H5310 Unspecified subjective visual disturbances: Secondary | ICD-10-CM | POA: Diagnosis not present

## 2018-12-20 DIAGNOSIS — I10 Essential (primary) hypertension: Secondary | ICD-10-CM | POA: Diagnosis not present

## 2018-12-26 DIAGNOSIS — K912 Postsurgical malabsorption, not elsewhere classified: Secondary | ICD-10-CM | POA: Diagnosis not present

## 2018-12-26 DIAGNOSIS — R7989 Other specified abnormal findings of blood chemistry: Secondary | ICD-10-CM | POA: Diagnosis not present

## 2018-12-26 DIAGNOSIS — M1990 Unspecified osteoarthritis, unspecified site: Secondary | ICD-10-CM | POA: Diagnosis not present

## 2018-12-26 DIAGNOSIS — Z0000 Encounter for general adult medical examination without abnormal findings: Secondary | ICD-10-CM | POA: Diagnosis not present

## 2018-12-26 DIAGNOSIS — E119 Type 2 diabetes mellitus without complications: Secondary | ICD-10-CM | POA: Diagnosis not present

## 2019-05-22 DIAGNOSIS — Z961 Presence of intraocular lens: Secondary | ICD-10-CM | POA: Diagnosis not present

## 2019-05-22 DIAGNOSIS — H401133 Primary open-angle glaucoma, bilateral, severe stage: Secondary | ICD-10-CM | POA: Diagnosis not present

## 2019-07-31 ENCOUNTER — Other Ambulatory Visit: Payer: Self-pay

## 2019-07-31 DIAGNOSIS — R6889 Other general symptoms and signs: Secondary | ICD-10-CM | POA: Diagnosis not present

## 2019-07-31 DIAGNOSIS — Z20822 Contact with and (suspected) exposure to covid-19: Secondary | ICD-10-CM

## 2019-08-01 LAB — NOVEL CORONAVIRUS, NAA: SARS-CoV-2, NAA: NOT DETECTED

## 2019-08-20 DIAGNOSIS — Z01419 Encounter for gynecological examination (general) (routine) without abnormal findings: Secondary | ICD-10-CM | POA: Diagnosis not present

## 2019-12-04 DIAGNOSIS — Z961 Presence of intraocular lens: Secondary | ICD-10-CM | POA: Diagnosis not present

## 2020-01-07 DIAGNOSIS — D1801 Hemangioma of skin and subcutaneous tissue: Secondary | ICD-10-CM | POA: Diagnosis not present

## 2020-01-07 DIAGNOSIS — L821 Other seborrheic keratosis: Secondary | ICD-10-CM | POA: Diagnosis not present

## 2020-01-07 DIAGNOSIS — D2239 Melanocytic nevi of other parts of face: Secondary | ICD-10-CM | POA: Diagnosis not present

## 2020-01-07 DIAGNOSIS — D692 Other nonthrombocytopenic purpura: Secondary | ICD-10-CM | POA: Diagnosis not present

## 2020-04-01 DIAGNOSIS — I10 Essential (primary) hypertension: Secondary | ICD-10-CM | POA: Diagnosis not present

## 2020-04-01 DIAGNOSIS — N390 Urinary tract infection, site not specified: Secondary | ICD-10-CM | POA: Diagnosis not present

## 2020-04-06 DIAGNOSIS — Z0000 Encounter for general adult medical examination without abnormal findings: Secondary | ICD-10-CM | POA: Diagnosis not present

## 2020-04-06 DIAGNOSIS — I10 Essential (primary) hypertension: Secondary | ICD-10-CM | POA: Diagnosis not present

## 2020-04-06 DIAGNOSIS — E1121 Type 2 diabetes mellitus with diabetic nephropathy: Secondary | ICD-10-CM | POA: Diagnosis not present

## 2020-04-06 DIAGNOSIS — N1832 Chronic kidney disease, stage 3b: Secondary | ICD-10-CM | POA: Diagnosis not present

## 2020-04-23 DIAGNOSIS — E1121 Type 2 diabetes mellitus with diabetic nephropathy: Secondary | ICD-10-CM | POA: Diagnosis not present

## 2020-04-23 DIAGNOSIS — N1832 Chronic kidney disease, stage 3b: Secondary | ICD-10-CM | POA: Diagnosis not present

## 2020-04-23 DIAGNOSIS — N2889 Other specified disorders of kidney and ureter: Secondary | ICD-10-CM | POA: Diagnosis not present

## 2020-04-23 DIAGNOSIS — I10 Essential (primary) hypertension: Secondary | ICD-10-CM | POA: Diagnosis not present

## 2020-06-23 DIAGNOSIS — E1121 Type 2 diabetes mellitus with diabetic nephropathy: Secondary | ICD-10-CM | POA: Diagnosis not present

## 2020-06-23 DIAGNOSIS — N1832 Chronic kidney disease, stage 3b: Secondary | ICD-10-CM | POA: Diagnosis not present

## 2020-07-08 DIAGNOSIS — I129 Hypertensive chronic kidney disease with stage 1 through stage 4 chronic kidney disease, or unspecified chronic kidney disease: Secondary | ICD-10-CM | POA: Diagnosis not present

## 2020-07-08 DIAGNOSIS — E1122 Type 2 diabetes mellitus with diabetic chronic kidney disease: Secondary | ICD-10-CM | POA: Diagnosis not present

## 2020-07-08 DIAGNOSIS — E669 Obesity, unspecified: Secondary | ICD-10-CM | POA: Diagnosis not present

## 2020-07-08 DIAGNOSIS — N1832 Chronic kidney disease, stage 3b: Secondary | ICD-10-CM | POA: Diagnosis not present

## 2020-07-13 DIAGNOSIS — E1121 Type 2 diabetes mellitus with diabetic nephropathy: Secondary | ICD-10-CM | POA: Diagnosis not present

## 2020-07-13 DIAGNOSIS — N1832 Chronic kidney disease, stage 3b: Secondary | ICD-10-CM | POA: Diagnosis not present

## 2020-07-13 DIAGNOSIS — Z01818 Encounter for other preprocedural examination: Secondary | ICD-10-CM | POA: Diagnosis not present

## 2020-07-20 DIAGNOSIS — E875 Hyperkalemia: Secondary | ICD-10-CM | POA: Diagnosis not present

## 2020-08-12 DIAGNOSIS — Z1231 Encounter for screening mammogram for malignant neoplasm of breast: Secondary | ICD-10-CM | POA: Diagnosis not present

## 2020-08-17 DIAGNOSIS — Z01812 Encounter for preprocedural laboratory examination: Secondary | ICD-10-CM | POA: Diagnosis not present

## 2020-08-24 DIAGNOSIS — I10 Essential (primary) hypertension: Secondary | ICD-10-CM | POA: Diagnosis not present

## 2020-09-07 DIAGNOSIS — Z961 Presence of intraocular lens: Secondary | ICD-10-CM | POA: Diagnosis not present

## 2020-09-07 DIAGNOSIS — H401133 Primary open-angle glaucoma, bilateral, severe stage: Secondary | ICD-10-CM | POA: Diagnosis not present

## 2020-09-24 DIAGNOSIS — I10 Essential (primary) hypertension: Secondary | ICD-10-CM | POA: Diagnosis not present

## 2020-11-09 DIAGNOSIS — I10 Essential (primary) hypertension: Secondary | ICD-10-CM | POA: Diagnosis not present

## 2021-01-19 DIAGNOSIS — D2239 Melanocytic nevi of other parts of face: Secondary | ICD-10-CM | POA: Diagnosis not present

## 2021-01-19 DIAGNOSIS — D225 Melanocytic nevi of trunk: Secondary | ICD-10-CM | POA: Diagnosis not present

## 2021-01-19 DIAGNOSIS — D1801 Hemangioma of skin and subcutaneous tissue: Secondary | ICD-10-CM | POA: Diagnosis not present

## 2021-01-19 DIAGNOSIS — L245 Irritant contact dermatitis due to other chemical products: Secondary | ICD-10-CM | POA: Diagnosis not present

## 2021-02-09 DIAGNOSIS — E1122 Type 2 diabetes mellitus with diabetic chronic kidney disease: Secondary | ICD-10-CM | POA: Diagnosis not present

## 2021-02-09 DIAGNOSIS — E875 Hyperkalemia: Secondary | ICD-10-CM | POA: Diagnosis not present

## 2021-02-09 DIAGNOSIS — N1832 Chronic kidney disease, stage 3b: Secondary | ICD-10-CM | POA: Diagnosis not present

## 2021-02-09 DIAGNOSIS — I129 Hypertensive chronic kidney disease with stage 1 through stage 4 chronic kidney disease, or unspecified chronic kidney disease: Secondary | ICD-10-CM | POA: Diagnosis not present

## 2021-03-10 DIAGNOSIS — Z961 Presence of intraocular lens: Secondary | ICD-10-CM | POA: Diagnosis not present

## 2021-03-10 DIAGNOSIS — H401133 Primary open-angle glaucoma, bilateral, severe stage: Secondary | ICD-10-CM | POA: Diagnosis not present

## 2021-04-07 DIAGNOSIS — E1121 Type 2 diabetes mellitus with diabetic nephropathy: Secondary | ICD-10-CM | POA: Diagnosis not present

## 2021-04-07 DIAGNOSIS — I10 Essential (primary) hypertension: Secondary | ICD-10-CM | POA: Diagnosis not present

## 2021-04-12 ENCOUNTER — Other Ambulatory Visit: Payer: Self-pay | Admitting: Internal Medicine

## 2021-04-12 DIAGNOSIS — E1121 Type 2 diabetes mellitus with diabetic nephropathy: Secondary | ICD-10-CM

## 2021-04-12 DIAGNOSIS — I10 Essential (primary) hypertension: Secondary | ICD-10-CM | POA: Diagnosis not present

## 2021-04-12 DIAGNOSIS — N2581 Secondary hyperparathyroidism of renal origin: Secondary | ICD-10-CM | POA: Diagnosis not present

## 2021-04-12 DIAGNOSIS — Z0000 Encounter for general adult medical examination without abnormal findings: Secondary | ICD-10-CM | POA: Diagnosis not present

## 2021-04-22 ENCOUNTER — Other Ambulatory Visit: Payer: Self-pay | Admitting: Internal Medicine

## 2021-04-22 DIAGNOSIS — I10 Essential (primary) hypertension: Secondary | ICD-10-CM

## 2021-04-27 DIAGNOSIS — E1122 Type 2 diabetes mellitus with diabetic chronic kidney disease: Secondary | ICD-10-CM | POA: Diagnosis not present

## 2021-04-27 DIAGNOSIS — N184 Chronic kidney disease, stage 4 (severe): Secondary | ICD-10-CM | POA: Diagnosis not present

## 2021-04-27 DIAGNOSIS — M1990 Unspecified osteoarthritis, unspecified site: Secondary | ICD-10-CM | POA: Diagnosis not present

## 2021-04-27 DIAGNOSIS — I129 Hypertensive chronic kidney disease with stage 1 through stage 4 chronic kidney disease, or unspecified chronic kidney disease: Secondary | ICD-10-CM | POA: Diagnosis not present

## 2021-06-08 ENCOUNTER — Ambulatory Visit (INDEPENDENT_AMBULATORY_CARE_PROVIDER_SITE_OTHER): Payer: Medicare Other | Admitting: Psychology

## 2021-06-08 DIAGNOSIS — F4323 Adjustment disorder with mixed anxiety and depressed mood: Secondary | ICD-10-CM

## 2021-06-10 DIAGNOSIS — E1122 Type 2 diabetes mellitus with diabetic chronic kidney disease: Secondary | ICD-10-CM | POA: Diagnosis not present

## 2021-06-10 DIAGNOSIS — Z9884 Bariatric surgery status: Secondary | ICD-10-CM | POA: Diagnosis not present

## 2021-06-24 ENCOUNTER — Ambulatory Visit
Admission: RE | Admit: 2021-06-24 | Discharge: 2021-06-24 | Disposition: A | Discharge status: Home / Self Care | Payer: No Typology Code available for payment source | Source: Ambulatory Visit | Attending: Internal Medicine | Admitting: Internal Medicine

## 2021-06-24 DIAGNOSIS — I10 Essential (primary) hypertension: Secondary | ICD-10-CM

## 2021-07-06 ENCOUNTER — Ambulatory Visit (INDEPENDENT_AMBULATORY_CARE_PROVIDER_SITE_OTHER): Payer: Medicare Other | Admitting: Psychology

## 2021-07-06 DIAGNOSIS — F4323 Adjustment disorder with mixed anxiety and depressed mood: Secondary | ICD-10-CM | POA: Diagnosis not present

## 2021-07-12 DIAGNOSIS — Z961 Presence of intraocular lens: Secondary | ICD-10-CM | POA: Diagnosis not present

## 2021-07-12 DIAGNOSIS — E1121 Type 2 diabetes mellitus with diabetic nephropathy: Secondary | ICD-10-CM | POA: Diagnosis not present

## 2021-07-12 DIAGNOSIS — H401133 Primary open-angle glaucoma, bilateral, severe stage: Secondary | ICD-10-CM | POA: Diagnosis not present

## 2021-07-13 DIAGNOSIS — E1121 Type 2 diabetes mellitus with diabetic nephropathy: Secondary | ICD-10-CM | POA: Diagnosis not present

## 2021-07-13 DIAGNOSIS — I10 Essential (primary) hypertension: Secondary | ICD-10-CM | POA: Diagnosis not present

## 2021-07-14 DIAGNOSIS — I700 Atherosclerosis of aorta: Secondary | ICD-10-CM | POA: Diagnosis not present

## 2021-07-14 DIAGNOSIS — N1832 Chronic kidney disease, stage 3b: Secondary | ICD-10-CM | POA: Diagnosis not present

## 2021-08-05 DIAGNOSIS — N1832 Chronic kidney disease, stage 3b: Secondary | ICD-10-CM | POA: Diagnosis not present

## 2021-08-09 DIAGNOSIS — E1122 Type 2 diabetes mellitus with diabetic chronic kidney disease: Secondary | ICD-10-CM | POA: Diagnosis not present

## 2021-08-09 DIAGNOSIS — I129 Hypertensive chronic kidney disease with stage 1 through stage 4 chronic kidney disease, or unspecified chronic kidney disease: Secondary | ICD-10-CM | POA: Diagnosis not present

## 2021-08-09 DIAGNOSIS — N1832 Chronic kidney disease, stage 3b: Secondary | ICD-10-CM | POA: Diagnosis not present

## 2021-08-09 DIAGNOSIS — E875 Hyperkalemia: Secondary | ICD-10-CM | POA: Diagnosis not present

## 2021-08-17 ENCOUNTER — Ambulatory Visit (INDEPENDENT_AMBULATORY_CARE_PROVIDER_SITE_OTHER): Payer: Medicare Other | Admitting: Psychology

## 2021-08-17 ENCOUNTER — Other Ambulatory Visit: Payer: Self-pay

## 2021-08-17 DIAGNOSIS — F4323 Adjustment disorder with mixed anxiety and depressed mood: Secondary | ICD-10-CM | POA: Diagnosis not present

## 2021-08-18 DIAGNOSIS — Z1231 Encounter for screening mammogram for malignant neoplasm of breast: Secondary | ICD-10-CM | POA: Diagnosis not present

## 2021-08-18 DIAGNOSIS — M85851 Other specified disorders of bone density and structure, right thigh: Secondary | ICD-10-CM | POA: Diagnosis not present

## 2021-08-18 DIAGNOSIS — M85852 Other specified disorders of bone density and structure, left thigh: Secondary | ICD-10-CM | POA: Diagnosis not present

## 2021-08-19 DIAGNOSIS — I700 Atherosclerosis of aorta: Secondary | ICD-10-CM | POA: Diagnosis not present

## 2021-08-24 DIAGNOSIS — H5213 Myopia, bilateral: Secondary | ICD-10-CM | POA: Diagnosis not present

## 2021-08-31 ENCOUNTER — Other Ambulatory Visit: Payer: Self-pay

## 2021-08-31 ENCOUNTER — Ambulatory Visit (INDEPENDENT_AMBULATORY_CARE_PROVIDER_SITE_OTHER): Payer: Medicare Other | Admitting: Psychology

## 2021-08-31 DIAGNOSIS — F4323 Adjustment disorder with mixed anxiety and depressed mood: Secondary | ICD-10-CM | POA: Diagnosis not present

## 2021-09-02 ENCOUNTER — Encounter: Payer: Self-pay | Admitting: Dietician

## 2021-09-02 ENCOUNTER — Other Ambulatory Visit: Payer: Self-pay

## 2021-09-02 ENCOUNTER — Encounter: Payer: Medicare Other | Attending: Internal Medicine | Admitting: Dietician

## 2021-09-02 DIAGNOSIS — N184 Chronic kidney disease, stage 4 (severe): Secondary | ICD-10-CM | POA: Diagnosis not present

## 2021-09-02 NOTE — Patient Instructions (Addendum)
Be mindful of choices of choices when eating out to choose those with less sodium. Choose lower potassium fruits and vegetables. Choose small portions of meat. Include carbohydrates with each meal (small portions).  Choose small amount of olive oil and vinegar or lemon on salads rather than regular dressing to decrease sodium intake.

## 2021-09-02 NOTE — Progress Notes (Signed)
Medical Nutrition Therapy  Appointment Start time:  6606  Appointment End time:  3016  Primary concerns today: She would like to learn more what to eat to lower her potassium and prevent worsening of kidneys that fits with post bariatric surgery needs.  She would like to avoid dialysis. She also states that she wants to lose 10-15 lbs.  Tried Rybelsus but did not tolerate the higher dose. Referral diagnosis: Type 2 diabetes, CKD stage 3b, HTN, obesity Preferred learning style: no preference indicated Learning readiness: ready, change in progress   NUTRITION ASSESSMENT   Anthropometrics  62" 148 lbs 09/02/2021 at home 254 lbs prior to bariatric surgery 2014   Clinical Medical Hx: CKD, HTN, obesity, osteopenia, Type 2 diabetes (denies) Medications: see list Labs: 02/09/2021:  Glucose 95, BUN 34, Creatinine 1.8, Potassium 5, eGFR 29, Phos 4.8 Notable Signs/Symptoms: none  Lifestyle & Dietary Hx Patient lives with her husband.  He follows a Mediterranean diet and has dementia.   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  Estimated daily fluid intake: 72 oz Supplements: vitamin D, vitamin B-12, calcium, biotin Stress / self-care: high at times Current average weekly physical activity: stays very busy, states she does not sit much, no structured exercise. Intolerant to [employer] (severe), most dairy, carbonated beverages  24-Hr Dietary Recall First Meal: granola bar OR oatmeal and raisins OR scrambled eggs, bacon, and toast OR cereal Snack: none Second Meal: Out to eat at [employer] frequently - small pasta, soup, salad OR small spaghetti and meatballs and soup Snack: occasional granola bar OR cottage cheese and fruit Third Meal: Chicken or Salmon OR homemade chicken orzo soup OR chicken tenders and honey mustard dressing OR occasional pizza Snack: none Beverages: hot and cold unsweetened tea, water  Estimated Energy  Needs Calories: 1400 Protein: 55 g  NUTRITION DIAGNOSIS  NB-1.1 Food and nutrition-related knowledge deficit As related to renal diet.  As evidenced by patient report and diet hx.   NUTRITION INTERVENTION  Nutrition education (E-1) on the following topics:  Discussed that weight loss may naturally occur as a result of a stricter renal diet and to avoid restricting further at this time as meal plan provided is lower in calories. Discussed protein needs for someone with CKD and evaluated protein goal for patient post bariatric Discussed that her diet should be low in sodium.  She uses no added salt and discussed that foods eaten out are high in sodium. Discussed basic meal planning.  Suggestions made to decrease sodium. Discussed low potassium fruit and vegetable options and to avoid "no salt" and "lite salt" products or other seasonings with potassium in the ingredient list Discussed phosphorous and to avoid foods with added phos... in the ingredient list Resources from [place]   Handouts Provided Include  NKD national kidney diet Dish up a Kidney Friendly Meal for those not on dialysis Copies of the Choose-a-meal book  Learning Style & Readiness for Change Teaching method utilized: Visual & Auditory  Demonstrated degree of understanding via: Teach Back  Barriers to learning/adherence to lifestyle change: time, stress  Goals Established by Pt Be mindful of choices of choices when eating out to choose those with less sodium. Choose lower potassium fruits and vegetables. Choose small portions of meat. Include carbohydrates with each meal (small portions).  Choose small amount of olive oil and vinegar or lemon on salads rather than regular dressing to decrease sodium intake.   MONITORING & EVALUATION Dietary  intake, weekly physical activity, and label reading prn.  Next Steps  Patient is to call for questions as desired.

## 2021-09-14 ENCOUNTER — Other Ambulatory Visit: Payer: Self-pay

## 2021-09-14 ENCOUNTER — Ambulatory Visit (INDEPENDENT_AMBULATORY_CARE_PROVIDER_SITE_OTHER): Payer: Medicare Other | Admitting: Psychology

## 2021-09-14 DIAGNOSIS — F4323 Adjustment disorder with mixed anxiety and depressed mood: Secondary | ICD-10-CM

## 2021-09-20 DIAGNOSIS — Z01419 Encounter for gynecological examination (general) (routine) without abnormal findings: Secondary | ICD-10-CM | POA: Diagnosis not present

## 2021-09-28 ENCOUNTER — Ambulatory Visit (INDEPENDENT_AMBULATORY_CARE_PROVIDER_SITE_OTHER): Payer: Medicare Other | Admitting: Psychology

## 2021-09-28 DIAGNOSIS — F4323 Adjustment disorder with mixed anxiety and depressed mood: Secondary | ICD-10-CM

## 2021-10-07 DIAGNOSIS — I10 Essential (primary) hypertension: Secondary | ICD-10-CM | POA: Diagnosis not present

## 2021-10-07 DIAGNOSIS — M810 Age-related osteoporosis without current pathological fracture: Secondary | ICD-10-CM | POA: Diagnosis not present

## 2021-10-07 DIAGNOSIS — I700 Atherosclerosis of aorta: Secondary | ICD-10-CM | POA: Diagnosis not present

## 2021-10-07 DIAGNOSIS — N1832 Chronic kidney disease, stage 3b: Secondary | ICD-10-CM | POA: Diagnosis not present

## 2021-10-12 ENCOUNTER — Other Ambulatory Visit: Payer: Self-pay

## 2021-10-12 ENCOUNTER — Ambulatory Visit (INDEPENDENT_AMBULATORY_CARE_PROVIDER_SITE_OTHER): Payer: Medicare Other | Admitting: Psychology

## 2021-10-12 DIAGNOSIS — F4323 Adjustment disorder with mixed anxiety and depressed mood: Secondary | ICD-10-CM

## 2021-10-26 ENCOUNTER — Other Ambulatory Visit: Payer: Self-pay

## 2021-10-26 ENCOUNTER — Ambulatory Visit (INDEPENDENT_AMBULATORY_CARE_PROVIDER_SITE_OTHER): Payer: Medicare Other | Admitting: Psychology

## 2021-10-26 DIAGNOSIS — F4323 Adjustment disorder with mixed anxiety and depressed mood: Secondary | ICD-10-CM

## 2021-11-16 ENCOUNTER — Ambulatory Visit: Payer: Medicare Other | Admitting: Psychology

## 2021-11-30 ENCOUNTER — Other Ambulatory Visit: Payer: Self-pay

## 2021-11-30 ENCOUNTER — Ambulatory Visit (INDEPENDENT_AMBULATORY_CARE_PROVIDER_SITE_OTHER): Payer: Medicare Other | Admitting: Psychology

## 2021-11-30 DIAGNOSIS — F4323 Adjustment disorder with mixed anxiety and depressed mood: Secondary | ICD-10-CM | POA: Diagnosis not present
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
## 2021-12-21 ENCOUNTER — Other Ambulatory Visit: Payer: Self-pay

## 2021-12-21 ENCOUNTER — Ambulatory Visit (INDEPENDENT_AMBULATORY_CARE_PROVIDER_SITE_OTHER): Payer: Medicare Other | Admitting: Psychology

## 2021-12-21 DIAGNOSIS — F4323 Adjustment disorder with mixed anxiety and depressed mood: Secondary | ICD-10-CM | POA: Diagnosis not present
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
## 2022-01-25 ENCOUNTER — Ambulatory Visit (INDEPENDENT_AMBULATORY_CARE_PROVIDER_SITE_OTHER): Payer: Medicare Other | Admitting: Psychology

## 2022-01-25 ENCOUNTER — Other Ambulatory Visit: Payer: Self-pay

## 2022-01-25 DIAGNOSIS — F4323 Adjustment disorder with mixed anxiety and depressed mood: Secondary | ICD-10-CM | POA: Diagnosis not present
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
## 2022-02-03 DIAGNOSIS — N1832 Chronic kidney disease, stage 3b: Secondary | ICD-10-CM | POA: Diagnosis not present

## 2022-02-08 DIAGNOSIS — I129 Hypertensive chronic kidney disease with stage 1 through stage 4 chronic kidney disease, or unspecified chronic kidney disease: Secondary | ICD-10-CM | POA: Diagnosis not present

## 2022-02-08 DIAGNOSIS — N200 Calculus of kidney: Secondary | ICD-10-CM | POA: Diagnosis not present

## 2022-02-08 DIAGNOSIS — N1832 Chronic kidney disease, stage 3b: Secondary | ICD-10-CM | POA: Diagnosis not present

## 2022-02-08 DIAGNOSIS — E1122 Type 2 diabetes mellitus with diabetic chronic kidney disease: Secondary | ICD-10-CM | POA: Diagnosis not present

## 2022-02-08 DIAGNOSIS — E875 Hyperkalemia: Secondary | ICD-10-CM | POA: Diagnosis not present

## 2022-02-15 DIAGNOSIS — D220 Melanocytic nevi of lip: Secondary | ICD-10-CM | POA: Diagnosis not present

## 2022-02-15 DIAGNOSIS — L814 Other melanin hyperpigmentation: Secondary | ICD-10-CM | POA: Diagnosis not present

## 2022-02-15 DIAGNOSIS — D692 Other nonthrombocytopenic purpura: Secondary | ICD-10-CM | POA: Diagnosis not present

## 2022-02-23 DIAGNOSIS — M810 Age-related osteoporosis without current pathological fracture: Secondary | ICD-10-CM | POA: Diagnosis not present

## 2022-02-28 DIAGNOSIS — Z961 Presence of intraocular lens: Secondary | ICD-10-CM | POA: Diagnosis not present

## 2022-02-28 DIAGNOSIS — H401133 Primary open-angle glaucoma, bilateral, severe stage: Secondary | ICD-10-CM | POA: Diagnosis not present

## 2022-03-08 ENCOUNTER — Ambulatory Visit: Payer: Medicare Other | Admitting: Psychology

## 2022-03-22 ENCOUNTER — Ambulatory Visit: Payer: Medicare Other | Admitting: Psychology

## 2022-04-07 DIAGNOSIS — I10 Essential (primary) hypertension: Secondary | ICD-10-CM | POA: Diagnosis not present

## 2022-04-07 DIAGNOSIS — E1121 Type 2 diabetes mellitus with diabetic nephropathy: Secondary | ICD-10-CM | POA: Diagnosis not present

## 2022-04-11 DIAGNOSIS — I10 Essential (primary) hypertension: Secondary | ICD-10-CM | POA: Diagnosis not present

## 2022-04-11 DIAGNOSIS — E1121 Type 2 diabetes mellitus with diabetic nephropathy: Secondary | ICD-10-CM | POA: Diagnosis not present

## 2022-04-14 DIAGNOSIS — Z0000 Encounter for general adult medical examination without abnormal findings: Secondary | ICD-10-CM | POA: Diagnosis not present

## 2022-04-14 DIAGNOSIS — E1121 Type 2 diabetes mellitus with diabetic nephropathy: Secondary | ICD-10-CM | POA: Diagnosis not present

## 2022-04-14 DIAGNOSIS — E875 Hyperkalemia: Secondary | ICD-10-CM | POA: Diagnosis not present

## 2022-04-14 DIAGNOSIS — E872 Acidosis, unspecified: Secondary | ICD-10-CM | POA: Diagnosis not present

## 2022-04-27 DIAGNOSIS — E875 Hyperkalemia: Secondary | ICD-10-CM | POA: Diagnosis not present

## 2022-05-03 DIAGNOSIS — E875 Hyperkalemia: Secondary | ICD-10-CM | POA: Diagnosis not present

## 2022-05-10 DIAGNOSIS — N184 Chronic kidney disease, stage 4 (severe): Secondary | ICD-10-CM | POA: Diagnosis not present

## 2022-05-10 DIAGNOSIS — E559 Vitamin D deficiency, unspecified: Secondary | ICD-10-CM | POA: Diagnosis not present

## 2022-05-18 DIAGNOSIS — N2581 Secondary hyperparathyroidism of renal origin: Secondary | ICD-10-CM | POA: Diagnosis not present

## 2022-05-18 DIAGNOSIS — N184 Chronic kidney disease, stage 4 (severe): Secondary | ICD-10-CM | POA: Diagnosis not present

## 2022-05-18 DIAGNOSIS — E1122 Type 2 diabetes mellitus with diabetic chronic kidney disease: Secondary | ICD-10-CM | POA: Diagnosis not present

## 2022-05-18 DIAGNOSIS — I129 Hypertensive chronic kidney disease with stage 1 through stage 4 chronic kidney disease, or unspecified chronic kidney disease: Secondary | ICD-10-CM | POA: Diagnosis not present

## 2022-06-14 DIAGNOSIS — N184 Chronic kidney disease, stage 4 (severe): Secondary | ICD-10-CM | POA: Diagnosis not present

## 2022-07-07 DIAGNOSIS — N184 Chronic kidney disease, stage 4 (severe): Secondary | ICD-10-CM | POA: Diagnosis not present

## 2022-07-18 DIAGNOSIS — H401133 Primary open-angle glaucoma, bilateral, severe stage: Secondary | ICD-10-CM | POA: Diagnosis not present

## 2022-07-18 DIAGNOSIS — Z961 Presence of intraocular lens: Secondary | ICD-10-CM | POA: Diagnosis not present

## 2022-07-18 DIAGNOSIS — E1121 Type 2 diabetes mellitus with diabetic nephropathy: Secondary | ICD-10-CM | POA: Diagnosis not present

## 2022-08-24 DIAGNOSIS — Z1231 Encounter for screening mammogram for malignant neoplasm of breast: Secondary | ICD-10-CM | POA: Diagnosis not present

## 2022-08-29 DIAGNOSIS — M810 Age-related osteoporosis without current pathological fracture: Secondary | ICD-10-CM | POA: Diagnosis not present

## 2022-09-27 DIAGNOSIS — I129 Hypertensive chronic kidney disease with stage 1 through stage 4 chronic kidney disease, or unspecified chronic kidney disease: Secondary | ICD-10-CM | POA: Diagnosis not present

## 2022-09-27 DIAGNOSIS — E1122 Type 2 diabetes mellitus with diabetic chronic kidney disease: Secondary | ICD-10-CM | POA: Diagnosis not present

## 2022-09-27 DIAGNOSIS — N2581 Secondary hyperparathyroidism of renal origin: Secondary | ICD-10-CM | POA: Diagnosis not present

## 2022-09-27 DIAGNOSIS — N184 Chronic kidney disease, stage 4 (severe): Secondary | ICD-10-CM | POA: Diagnosis not present

## 2022-10-07 DIAGNOSIS — N184 Chronic kidney disease, stage 4 (severe): Secondary | ICD-10-CM | POA: Diagnosis not present

## 2022-12-21 DIAGNOSIS — H401133 Primary open-angle glaucoma, bilateral, severe stage: Secondary | ICD-10-CM | POA: Diagnosis not present

## 2022-12-21 DIAGNOSIS — Z961 Presence of intraocular lens: Secondary | ICD-10-CM | POA: Diagnosis not present

## 2022-12-21 DIAGNOSIS — E1121 Type 2 diabetes mellitus with diabetic nephropathy: Secondary | ICD-10-CM | POA: Diagnosis not present

## 2022-12-23 DIAGNOSIS — I129 Hypertensive chronic kidney disease with stage 1 through stage 4 chronic kidney disease, or unspecified chronic kidney disease: Secondary | ICD-10-CM | POA: Diagnosis not present

## 2022-12-28 DIAGNOSIS — I129 Hypertensive chronic kidney disease with stage 1 through stage 4 chronic kidney disease, or unspecified chronic kidney disease: Secondary | ICD-10-CM | POA: Diagnosis not present

## 2022-12-28 DIAGNOSIS — N2581 Secondary hyperparathyroidism of renal origin: Secondary | ICD-10-CM | POA: Diagnosis not present

## 2022-12-28 DIAGNOSIS — E1122 Type 2 diabetes mellitus with diabetic chronic kidney disease: Secondary | ICD-10-CM | POA: Diagnosis not present

## 2022-12-28 DIAGNOSIS — N184 Chronic kidney disease, stage 4 (severe): Secondary | ICD-10-CM | POA: Diagnosis not present

## 2023-01-03 DIAGNOSIS — H5213 Myopia, bilateral: Secondary | ICD-10-CM | POA: Diagnosis not present

## 2023-01-17 DIAGNOSIS — N184 Chronic kidney disease, stage 4 (severe): Secondary | ICD-10-CM | POA: Diagnosis not present

## 2023-03-15 DIAGNOSIS — M810 Age-related osteoporosis without current pathological fracture: Secondary | ICD-10-CM | POA: Diagnosis not present

## 2023-03-24 DIAGNOSIS — N184 Chronic kidney disease, stage 4 (severe): Secondary | ICD-10-CM | POA: Diagnosis not present

## 2023-03-27 DIAGNOSIS — E1122 Type 2 diabetes mellitus with diabetic chronic kidney disease: Secondary | ICD-10-CM | POA: Diagnosis not present

## 2023-03-27 DIAGNOSIS — I129 Hypertensive chronic kidney disease with stage 1 through stage 4 chronic kidney disease, or unspecified chronic kidney disease: Secondary | ICD-10-CM | POA: Diagnosis not present

## 2023-03-27 DIAGNOSIS — N184 Chronic kidney disease, stage 4 (severe): Secondary | ICD-10-CM | POA: Diagnosis not present

## 2023-03-27 DIAGNOSIS — N2581 Secondary hyperparathyroidism of renal origin: Secondary | ICD-10-CM | POA: Diagnosis not present

## 2023-04-11 DIAGNOSIS — L821 Other seborrheic keratosis: Secondary | ICD-10-CM | POA: Diagnosis not present

## 2023-04-11 DIAGNOSIS — D2239 Melanocytic nevi of other parts of face: Secondary | ICD-10-CM | POA: Diagnosis not present

## 2023-04-11 DIAGNOSIS — D692 Other nonthrombocytopenic purpura: Secondary | ICD-10-CM | POA: Diagnosis not present

## 2023-04-11 DIAGNOSIS — L814 Other melanin hyperpigmentation: Secondary | ICD-10-CM | POA: Diagnosis not present

## 2023-04-14 DIAGNOSIS — I10 Essential (primary) hypertension: Secondary | ICD-10-CM | POA: Diagnosis not present

## 2023-04-14 DIAGNOSIS — E1122 Type 2 diabetes mellitus with diabetic chronic kidney disease: Secondary | ICD-10-CM | POA: Diagnosis not present

## 2023-04-19 DIAGNOSIS — E1121 Type 2 diabetes mellitus with diabetic nephropathy: Secondary | ICD-10-CM | POA: Diagnosis not present

## 2023-04-19 DIAGNOSIS — Z0000 Encounter for general adult medical examination without abnormal findings: Secondary | ICD-10-CM | POA: Diagnosis not present

## 2023-04-19 DIAGNOSIS — K912 Postsurgical malabsorption, not elsewhere classified: Secondary | ICD-10-CM | POA: Diagnosis not present

## 2023-04-19 DIAGNOSIS — R1319 Other dysphagia: Secondary | ICD-10-CM | POA: Diagnosis not present

## 2023-05-31 DIAGNOSIS — H02422 Myogenic ptosis of left eyelid: Secondary | ICD-10-CM | POA: Diagnosis not present

## 2023-05-31 DIAGNOSIS — H57812 Brow ptosis, left: Secondary | ICD-10-CM | POA: Diagnosis not present

## 2023-05-31 DIAGNOSIS — H401133 Primary open-angle glaucoma, bilateral, severe stage: Secondary | ICD-10-CM | POA: Diagnosis not present

## 2023-06-27 DIAGNOSIS — R1310 Dysphagia, unspecified: Secondary | ICD-10-CM | POA: Diagnosis not present

## 2023-07-14 DIAGNOSIS — N184 Chronic kidney disease, stage 4 (severe): Secondary | ICD-10-CM | POA: Diagnosis not present

## 2023-07-18 DIAGNOSIS — N2581 Secondary hyperparathyroidism of renal origin: Secondary | ICD-10-CM | POA: Diagnosis not present

## 2023-07-18 DIAGNOSIS — I129 Hypertensive chronic kidney disease with stage 1 through stage 4 chronic kidney disease, or unspecified chronic kidney disease: Secondary | ICD-10-CM | POA: Diagnosis not present

## 2023-07-18 DIAGNOSIS — N184 Chronic kidney disease, stage 4 (severe): Secondary | ICD-10-CM | POA: Diagnosis not present

## 2023-07-18 DIAGNOSIS — E1122 Type 2 diabetes mellitus with diabetic chronic kidney disease: Secondary | ICD-10-CM | POA: Diagnosis not present

## 2023-07-19 DIAGNOSIS — Z1211 Encounter for screening for malignant neoplasm of colon: Secondary | ICD-10-CM | POA: Diagnosis not present

## 2023-07-19 DIAGNOSIS — R1310 Dysphagia, unspecified: Secondary | ICD-10-CM | POA: Diagnosis not present

## 2023-07-19 DIAGNOSIS — K5730 Diverticulosis of large intestine without perforation or abscess without bleeding: Secondary | ICD-10-CM | POA: Diagnosis not present

## 2023-07-19 DIAGNOSIS — Z9884 Bariatric surgery status: Secondary | ICD-10-CM | POA: Diagnosis not present

## 2023-07-19 DIAGNOSIS — K648 Other hemorrhoids: Secondary | ICD-10-CM | POA: Diagnosis not present

## 2023-07-19 DIAGNOSIS — K2930 Chronic superficial gastritis without bleeding: Secondary | ICD-10-CM | POA: Diagnosis not present

## 2023-07-26 DIAGNOSIS — K2930 Chronic superficial gastritis without bleeding: Secondary | ICD-10-CM | POA: Diagnosis not present

## 2023-08-30 DIAGNOSIS — Z1231 Encounter for screening mammogram for malignant neoplasm of breast: Secondary | ICD-10-CM | POA: Diagnosis not present

## 2023-09-06 DIAGNOSIS — Z23 Encounter for immunization: Secondary | ICD-10-CM | POA: Diagnosis not present

## 2023-09-12 DIAGNOSIS — Z8262 Family history of osteoporosis: Secondary | ICD-10-CM | POA: Diagnosis not present

## 2023-09-12 DIAGNOSIS — M8588 Other specified disorders of bone density and structure, other site: Secondary | ICD-10-CM | POA: Diagnosis not present

## 2023-09-15 DIAGNOSIS — M810 Age-related osteoporosis without current pathological fracture: Secondary | ICD-10-CM | POA: Diagnosis not present

## 2023-09-21 DIAGNOSIS — M810 Age-related osteoporosis without current pathological fracture: Secondary | ICD-10-CM | POA: Diagnosis not present

## 2023-10-13 DIAGNOSIS — N184 Chronic kidney disease, stage 4 (severe): Secondary | ICD-10-CM | POA: Diagnosis not present

## 2023-10-18 DIAGNOSIS — I129 Hypertensive chronic kidney disease with stage 1 through stage 4 chronic kidney disease, or unspecified chronic kidney disease: Secondary | ICD-10-CM | POA: Diagnosis not present

## 2023-10-18 DIAGNOSIS — E1122 Type 2 diabetes mellitus with diabetic chronic kidney disease: Secondary | ICD-10-CM | POA: Diagnosis not present

## 2023-10-18 DIAGNOSIS — D649 Anemia, unspecified: Secondary | ICD-10-CM | POA: Diagnosis not present

## 2023-10-18 DIAGNOSIS — N184 Chronic kidney disease, stage 4 (severe): Secondary | ICD-10-CM | POA: Diagnosis not present

## 2023-10-30 DIAGNOSIS — H02422 Myogenic ptosis of left eyelid: Secondary | ICD-10-CM | POA: Diagnosis not present

## 2023-10-30 DIAGNOSIS — H57812 Brow ptosis, left: Secondary | ICD-10-CM | POA: Diagnosis not present

## 2023-10-30 DIAGNOSIS — Z961 Presence of intraocular lens: Secondary | ICD-10-CM | POA: Diagnosis not present

## 2023-10-30 DIAGNOSIS — H401133 Primary open-angle glaucoma, bilateral, severe stage: Secondary | ICD-10-CM | POA: Diagnosis not present

## 2023-11-03 DIAGNOSIS — N184 Chronic kidney disease, stage 4 (severe): Secondary | ICD-10-CM | POA: Diagnosis not present

## 2023-12-07 DIAGNOSIS — J0100 Acute maxillary sinusitis, unspecified: Secondary | ICD-10-CM | POA: Diagnosis not present

## 2023-12-07 DIAGNOSIS — J069 Acute upper respiratory infection, unspecified: Secondary | ICD-10-CM | POA: Diagnosis not present

## 2023-12-27 DIAGNOSIS — Z01419 Encounter for gynecological examination (general) (routine) without abnormal findings: Secondary | ICD-10-CM | POA: Diagnosis not present

## 2024-01-02 DIAGNOSIS — E872 Acidosis, unspecified: Secondary | ICD-10-CM | POA: Diagnosis not present

## 2024-01-02 DIAGNOSIS — N184 Chronic kidney disease, stage 4 (severe): Secondary | ICD-10-CM | POA: Diagnosis not present

## 2024-01-02 DIAGNOSIS — E1122 Type 2 diabetes mellitus with diabetic chronic kidney disease: Secondary | ICD-10-CM | POA: Diagnosis not present

## 2024-01-02 DIAGNOSIS — I129 Hypertensive chronic kidney disease with stage 1 through stage 4 chronic kidney disease, or unspecified chronic kidney disease: Secondary | ICD-10-CM | POA: Diagnosis not present

## 2024-01-02 DIAGNOSIS — N2581 Secondary hyperparathyroidism of renal origin: Secondary | ICD-10-CM | POA: Diagnosis not present

## 2024-01-02 DIAGNOSIS — D649 Anemia, unspecified: Secondary | ICD-10-CM | POA: Diagnosis not present

## 2024-01-02 DIAGNOSIS — E875 Hyperkalemia: Secondary | ICD-10-CM | POA: Diagnosis not present

## 2024-02-14 ENCOUNTER — Ambulatory Visit: Payer: Medicare Other | Admitting: Psychiatry

## 2024-02-14 DIAGNOSIS — F411 Generalized anxiety disorder: Secondary | ICD-10-CM
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
## 2024-02-22 ENCOUNTER — Ambulatory Visit (INDEPENDENT_AMBULATORY_CARE_PROVIDER_SITE_OTHER): Admitting: Psychiatry

## 2024-02-22 DIAGNOSIS — F411 Generalized anxiety disorder: Secondary | ICD-10-CM

## 2024-02-22 NOTE — Progress Notes (Signed)
 [facility] Counselor/Therapist Progress Note  Patient ID: [name], MRN: [id number],    Date: 02/22/2024  Time Spent: 55 minutes   Treatment Type: Individual Therapy  Reported Symptoms: anxiety, depression, hopelessness, frustration, anger, needing support, sadness    Mental Status Exam:  Appearance:   Casual and Neat     Behavior:  Appropriate, Sharing, and Motivated  Motor:  Normal  Speech/Language:   Clear and Coherent  Affect:  Anxious, depressed, some anger  Mood:  anxious  Thought process:  goal directed  Thought content:    WNL  Sensory/Perceptual disturbances:    WNL  Orientation:  oriented to person, place, time/date, situation, day of week, month of year, year, and stated date of February 22, 2024  Attention:  Fair  Concentration:  Good  Memory:  WNL  Fund of knowledge:   Good  Insight:    Good  Judgment:   Good  Impulse Control:  Good   Risk Assessment: Danger to Self:  No Self-injurious Behavior: No Danger to Others: No Duty to Warn:no Physical Aggression / Violence:No  Access to Firearms a concern: No  Gang Involvement:No   Subjective:  Patient in today needing to talk and work further today on relationship with her 22 yr old daughter with alcohol/drug problem. Patient has previously attended some NA meetings and saw a therapist with her husband about 5 yrs ago. Talking through today part of the journey she and husband have been on re: daughter's continued substance abuse issues.Patient sharing more of the family history particularly regarding their daughter. Venting and working with strong frustration, anxiety, depression, anger, sadness, and needing support. Discusses strategies for helping her manage the difficult feelings and also strategies to help her cope with all the stress and anxiety/some depression she is feeling currently. States talking helps a lot and talking about strategies helps her feel less stressed. Husband diagnosed in January  2023 with early stages of dementia and continues to have some decline but is able to get dressed, bathe independently, "even though he has had a stroke within the past year".  Patient struggles with his dementia but does a really good job and supporting and including him in their daily lives together.  Adult daughter as noted above is a significant stressor for patient and her husband, and patient tries to balance her concerning care with also setting appropriate limits on daughter's demands at times..  Husband continues to be on medication with Dr. Jennelle Human here at our practice.  Not all details included in this note due to patient privacy needs".  Concerned about future for her adult daughter due to her chronic abuse of alcohol and drugs, and also has a diagnosis of schizophrenia and bipolar.  Interventions: Cognitive Behavioral Therapy, Solution-Oriented/Positive Psychology, and Ego-Supportive  1.Reduce overall level, frequency, and intensity of the anxiety so that daily functioning is not impaired. 2.Increase understanding of beliefs and messages that produce the worry and anxiety. 3.Identify, challenge, and replace anxious/fearful self-talk with positive, realistic, and empowering self-talk.  Diagnosis:   ICD-10-CM   1. Generalized anxiety disorder  F41.1      Plan:  Patient today showing good motivation and active participation in session as we followed up on some of the points that she had shared in her previous session which was her initial evaluation for therapy.  Continues to work on her anxiety, depression, frustration, anger, sadness, and reports that her hopelessness is not as strong now.  Goal review and progress/challenges noted  with patient.  Next appointment within 2 to 3 weeks.   Mathis Fare, LCSW

## 2024-02-28 ENCOUNTER — Ambulatory Visit: Admitting: Psychiatry

## 2024-02-28 DIAGNOSIS — F411 Generalized anxiety disorder: Secondary | ICD-10-CM | POA: Diagnosis not present

## 2024-02-28 NOTE — Progress Notes (Signed)
 [facility] Counselor/Therapist Progress Note  Patient ID: [name], MRN: [id number],    Date: 02/28/2024  Time Spent: 55 minutes   Treatment Type: Individual Therapy  Reported Symptoms:  anxiety, depression decreasing, hopelessness decreasing, anger, frustration, needing support, sadness    Mental Status Exam:  Appearance:   Neat and Well Groomed     Behavior:  Appropriate, Sharing, and Motivated  Motor:  Normal  Speech/Language:   Normal Rate  Affect:  Depressed and anxious  Mood:  anxious and depressed  Thought process:  goal directed  Thought content:    WNL  Sensory/Perceptual disturbances:    WNL  Orientation:  oriented to person, place, time/date, situation, day of week, month of year, year, and stated date of February 28, 2024  Attention:  Fair  Concentration:  Good and Fair  Memory:  WNL  Fund of knowledge:   Good  Insight:    Good  Judgment:   Good  Impulse Control:  Good   Risk Assessment: Danger to Self:  No Self-injurious Behavior: No Danger to Others: No Duty to Warn:no Physical Aggression / Violence:No  Access to Firearms a concern: No  Gang Involvement:No   Subjective:   Patient in for her session today and first of all was following up on some of the work she did last session regarding her relationship with her 51 year old daughter who has significant alcohol/drug problems. Since patient's last appt, adult daughter of concern was evicted by her landlord and has til end of April to move out. Needing to focus today to share much more of her frustrations with adult daughter and daughter's poor decision-making. Concerns about her own mental health and seeing and experiencing more of her adult daughter's issues/behaviors. Worked to see more options for herself in terms of limit-setting with others within the family as needed, and also some activities out side of her home that could be helpful to patient and husband. Discussed communication upgrade for  patient in better managing some talks she has with adult daughter and husband. Does share some positives for herself although understandably has a hard time focusing on the positives due to some of the significant challenges with which she is living. Continues to try to set healthy boundaries with adult daughter who is substance abuser and poor Publishing rights manager. Working well on her anxiety, frustrations, anger, depression and sadness and states she is already seeing some progress in her ability to set better limits, not personalizing everything, and learning better ways of taking care of herself and saying "no" as needed.  Continues to share some of her concerns about her husband who was diagnosed in 2023 with early stages of dementia and she is noticing continued decline.  Husband is able to continue caring for himself such as dressing showering independently at this time.  He also is very able to have conversations and enjoy activities with his wife and family but will sometimes get confused with details, names, and repeats himself often.  Patient remains on her medication and is participating very actively in therapy.  She remains very concerned about her adult daughter due to the chronic nature of her alcohol and drug abuse along with her diagnosis of schizophrenia and bipolar disorder.  Patient remaining on task with her goals and participating well in sessions and following up outside of sessions.  Interventions: Cognitive Behavioral Therapy and Ego-Supportive 1.Reduce overall level, frequency, and intensity of the anxiety so that daily functioning is not impaired. 2.Increase understanding  of beliefs and messages that produce the worry and anxiety. 3.Identify, challenge, and replace anxious/fearful self-talk with positive, realistic, and empowering self-talk.  Diagnosis:   ICD-10-CM   1. Generalized anxiety disorder  F41.1      Plan:  Patient today motivated and working further on her decreasing  hopelessness, depression, frustration, anxiety, anger, and sadness.  She is showing great motivation and very active participation in sessions, good follow-up on any homework assigned.  She is active in sessions and over talks at times however she has so much going on her life that she wants to talk about it is hard for her sometimes to prioritize.  Showing great effort at making some changes and better understanding parts of her situation and why certain responses do not work well and others work better.  Goal review and progress/challenges noted with patient.  Next appointment within 2 to 3 weeks.   Mathis Fare, LCSW

## 2024-03-04 ENCOUNTER — Ambulatory Visit (INDEPENDENT_AMBULATORY_CARE_PROVIDER_SITE_OTHER): Admitting: Psychiatry

## 2024-03-04 DIAGNOSIS — F411 Generalized anxiety disorder: Secondary | ICD-10-CM

## 2024-03-04 NOTE — Progress Notes (Signed)
 [facility] Counselor/Therapist Progress Note  Patient ID: [name], MRN: [id number],    Date: 03/04/2024  Time Spent: 58 minutes    Treatment Type: Individual Therapy  Reported Symptoms: anxiety, depression decreasing further, hopeless decreasing, anger, frustration, needing support, sadness     Mental Status Exam:  Appearance:   Casual, Neat, and Well Groomed     Behavior:  Appropriate, Sharing, and Motivated  Motor:  Normal  Speech/Language:   Normal Rate  Affect:  Depressed and anxious  Mood:  anxious and depressed  Thought process:  goal directed  Thought content:    WNL  Sensory/Perceptual disturbances:    WNL  Orientation:  oriented to person, place, time/date, situation, day of week, month of year, year, and stated date of March 04, 2024  Attention:  Good  Concentration:  Good  Memory:  WNL  Fund of knowledge:   Good  Insight:    Good  Judgment:   Good  Impulse Control:  Good/Fair   Risk Assessment: Danger to Self:  No Self-injurious Behavior: No Danger to Others: No Duty to Warn:no Physical Aggression / Violence:No  Access to Firearms a concern: No  Gang Involvement:No    Subjective: Patient today in for session following up from her work on personal and family issues last session. 79-yr old daughter has started calling patient and husband much earlier in the a.m. asking for money as she had "spent all her money for the month." Trying to set more clear boundaries with adult daughter as "this has been an ongoing issue with patient's daughter. Issues discussed re: trans-gender member of family (not all details included in this note due to patient privacy needs.) Proceeds to discuss her stress in trying to help daughter primarily due to trying to help her grandson (grand-daughter who identifies as trans-gender female.)  Adult daughter still making poor decisions. Patient trying to have some connection that is healthy but also with clear limits/boundaries. Not  so close with "Nedra Hai" transgender grandson/grand-daughter. Continues to struggle with the relationship and making good/healthy decisions about it.  (Not all details included in this note due to patient privacy needs).  Discussing boundaries and limit setting within the family.  Also trying to improve communication patterns.  Wants to better manage some of the talks that she has with her adult daughter and and husband.  Does see some positives for herself even in the midst of a lot of family stressors.  Seeks to set healthy boundaries with adult daughter who continues to abuse substances and is a very poor Publishing rights manager.  Patient working further on her anxiety, anger, depression, frustrations, and wanting to make healthy decisions.  Patient's husband who has early stages of dementia is able to continue caring for himself independently as far as dressing and showering at this point, however needs assistance and some oversight of patient with other areas of life.  Husband is able to enjoy relationships and have conversations with people although can get confused with names, details, and things that he repeats often.  Patient reports remaining on her medication and she is very active in therapy and motivated in positive ways.  She is very concerned about her daughter's continued abuse of drugs and alcohol and her diagnosis of schizophrenia and bipolar disorder.  She helps her daughter at times and support and financial ways but also tries to set appropriate limits for her daughter.  Interventions: Cognitive Behavioral Therapy, Solution-Oriented/Positive Psychology, and Ego-Supportive  1.Reduce overall level, frequency,  and intensity of the anxiety so that daily functioning is not impaired. 2.Increase understanding of beliefs and messages that produce the worry and anxiety. 3.Identify, challenge, and replace anxious/fearful self-talk with positive, realistic, and empowering self-talk.  Diagnosis:   ICD-10-CM   1.  Generalized anxiety disorder  F41.1      Plan:   Patient very motivated and working well today on goal-directed behaviors and trying to set reasonable boundaries with daughter who continues to abuse alcohol and drugs.  Patient continues working on decreasing her hopelessness, improving her depression and frustration, decreasing her anxiety and anger, and better managing her sadness.  She has shown really good motivation and very active participation in sessions, good follow-up on any homework that is assigned, and other suggested strategies between sessions.  She has a lot going on in her life which makes it difficult for her to prioritize her issues at times but definitely works badly in sessions.  Noticing some changes already and also better understanding on her part in certain situations and why certain strategies and responses do not work as well and others are more effective.  Has good attitude and wants to feel stronger and self-assured.  Goal review and progress/challenges noted with patient.  Next appointment within 2 to 3 weeks.   Mathis Fare, LCSW

## 2024-03-19 ENCOUNTER — Ambulatory Visit: Admitting: Psychiatry

## 2024-03-22 DIAGNOSIS — N184 Chronic kidney disease, stage 4 (severe): Secondary | ICD-10-CM | POA: Diagnosis not present

## 2024-03-25 DIAGNOSIS — M810 Age-related osteoporosis without current pathological fracture: Secondary | ICD-10-CM | POA: Diagnosis not present

## 2024-03-26 ENCOUNTER — Ambulatory Visit (INDEPENDENT_AMBULATORY_CARE_PROVIDER_SITE_OTHER): Admitting: Psychiatry

## 2024-03-26 DIAGNOSIS — F411 Generalized anxiety disorder: Secondary | ICD-10-CM

## 2024-03-26 NOTE — Progress Notes (Signed)
 [facility] Counselor/Therapist Progress Note  Patient ID: [name], MRN: [id number],    Date: 03/26/2024  Time Spent: 58 minutes   Treatment Type: Individual Therapy  Reported Symptoms: anxiety, depression (some improvement), hopelessness still decreasing, anger, frustration, needing support, sadness   Mental Status Exam:  Appearance:   Casual and Neat     Behavior:  Appropriate, Sharing, and Motivated  Motor:  Normal  Speech/Language:   Clear and Coherent  Affect:  anxious  Mood:  anxious  Thought process:  goal directed  Thought content:    Some ruminating  Sensory/Perceptual disturbances:    WNL  Orientation:  oriented to person, place, time/date, situation, day of week, month of year, year, and stated date of March 26, 2024  Attention:  Good  Concentration:  Good and Fair  Memory:  WNL  Fund of knowledge:   Good  Insight:    Good  Judgment:   Good  Impulse Control:  Good   Risk Assessment: Danger to Self:  No Self-injurious Behavior: No Danger to Others: No Duty to Warn:no Physical Aggression / Violence:No  Access to Firearms a concern: No  Gang Involvement:No   Subjective: Patient in session today working further on her anxiety, family issues including with 79 yr old daughter (who struggles with bipolar and schizophrenia, and poor decisions).  Continued work on clear communication and boundaries with others. Patient uncomfortable and frustrated at times, trying to set healthier boundaries with adult daughter. Worked further today on her coping with husband's memory care issues that have increased some recently. Processed more options for further support that may eventually be helpful to patient and husband (considering his needs also). (Not all details included in this note due to patient privacy needs.) Patient showing lots of good energy in session and very committed in her treatment.  Difficulty at times setting certain limits with adult daughter and is  making progress.  Realizing she needs to focus and on her own needs as well as trying to help take care of family needs.  Discussed some "positives" for her currently including what is helpful in certain situations and what she finds is not helpful.  Good contact with others who care about her and her family.  Continues work on boundary issues and managing her stress in healthier ways.  Continues to work on Insurance account manager of her anxiety, anger, frustration, and some depression.  Specifically wanting to continue trying to make healthy decisions going forward.  Is concerned about her husband with his early stages of dementia and feels that he is not as aware of some of the changes that she has seen with him.  He is still able to enjoy friendships, conversations, and family relationships but sometimes repeats himself often, has some confusion with names and details of situations or events.  Her concern for her daughter who was diagnosed with schizophrenia and bipolar disorder continue and particularly surrounding her use of drugs and alcohol.  Remains good at setting limits with her daughter most of the time per patient report and her sharing of several examples more recently.  Interventions: Cognitive Behavioral Therapy, Solution-Oriented/Positive Psychology, and Ego-Supportive  1.Reduce overall level, frequency, and intensity of the anxiety so that daily functioning is not impaired. 2.Increase understanding of beliefs and messages that produce the worry and anxiety. 3.Identify, challenge, and replace anxious/fearful self-talk with positive, realistic, and empowering self-talk.  Diagnosis:   ICD-10-CM   1. Generalized anxiety disorder  F41.1  Plan: Patient today in session working well from start to finish as she continues to try and manage her on mental health concerns lead her to experience anxiety, depression, anger, and frustrations.  She also tries to support family members who have some varied  mental health concerns, trying not to enable, and set appropriate boundaries.  Patient is definitely showing progress and needs to continue her work with goal-directed behaviors in order to keep moving and a more hopeful and positive direction.  Goal review and progress/challenges noted with patient.  Next appointment within 1 to 2 weeks.   Kelleen Patee, LCSW

## 2024-03-27 DIAGNOSIS — I129 Hypertensive chronic kidney disease with stage 1 through stage 4 chronic kidney disease, or unspecified chronic kidney disease: Secondary | ICD-10-CM | POA: Diagnosis not present

## 2024-03-27 DIAGNOSIS — N184 Chronic kidney disease, stage 4 (severe): Secondary | ICD-10-CM | POA: Diagnosis not present

## 2024-03-27 DIAGNOSIS — N2581 Secondary hyperparathyroidism of renal origin: Secondary | ICD-10-CM | POA: Diagnosis not present

## 2024-03-27 DIAGNOSIS — E1122 Type 2 diabetes mellitus with diabetic chronic kidney disease: Secondary | ICD-10-CM | POA: Diagnosis not present

## 2024-04-01 ENCOUNTER — Ambulatory Visit (INDEPENDENT_AMBULATORY_CARE_PROVIDER_SITE_OTHER): Admitting: Psychiatry

## 2024-04-01 DIAGNOSIS — F411 Generalized anxiety disorder: Secondary | ICD-10-CM | POA: Diagnosis not present

## 2024-04-01 NOTE — Progress Notes (Signed)
 [facility] Counselor/Therapist Progress Note  Patient ID: [name], MRN: [id number],    Date: 04/01/2024  Time Spent: 55 minutes   Treatment Type: Individual Therapy  Reported Symptoms: anxiety, depression, some hopelessness "but would not harm herself or anyone else", anger, frustration, needing support, sadness    Mental Status Exam:  Appearance:   Casual and Neat     Behavior:  Appropriate, Sharing, and Motivated  Motor:  Normal  Speech/Language:   Clear and Coherent  Affect:  Depressed and anxious  Mood:  anxious and depressed  Thought process:  goal directed  Thought content:    WNL  Sensory/Perceptual disturbances:    WNL  Orientation:  oriented to person, place, time/date, situation, day of week, month of year, year, and stated date of Apr 01, 2024  Attention:  Fair  Concentration:  Good and Fair  Memory:  WNL  Fund of knowledge:   Good  Insight:    Good  Judgment:   Good  Impulse Control:  Good and Fair   Risk Assessment: Danger to Self:  No Self-injurious Behavior: No Danger to Others: No Duty to Warn:no Physical Aggression / Violence:No  Access to Firearms a concern: No  Gang Involvement:No   Subjective:   Patient today showing active participation and motivation in session working further on her anxiety and multiple family issues including a 33 year old daughter with bipolar and schizophrenia who struggles with poor decisions.  Reporting symptoms of less hope, depression, anxiety, anger, frustration sadness. Husband also is in early stages of dementia and patient is not sure that he is realizing his own changes which she is seeing. "Two important things.....daughter did move out of her rental house and got an apt in [place] and is moving in today with parent's financial support". Trying to maintain healthier boundaries with adult daughter. Concerned for her daughter and some of her choices, and also concerned for herself in the midst of significant  family challenges. 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  Concerned about her husband especially in light of this news regarding her own health conditions, as she continues to consider his knees as well and especially his memory care issues.  (Not all details included in this note due to patient privacy needs).  Plans to talk with her minister soon and I supported this for her.  Interventions: Cognitive Behavioral Therapy and Ego-Supportive 1.Reduce overall level, frequency, and intensity of the anxiety so that daily functioning is not impaired. 2.Increase understanding of beliefs and messages that produce the worry and anxiety. 3.Identify, challenge, and replace anxious/fearful self-talk with positive, realistic, and empowering self-talk.  Diagnosis:   ICD-10-CM   1. Generalized anxiety disorder  F41.1      Plan:   Patient in session today showing good effort and motivation in session today as she had recently received some very concerning news regarding her own health from her kidney doctor.  Shared that information in session today and worked through a lot of emotions and fears, and also concerns about her husband and other family members.  [name] is showing progress and needs to continue working with her goal-directed behaviors so as to keep focused on her own health and making  good choices.  Goal review and progress/challenges noted with patient.  Next appointment within 1 to 2 weeks.   Vicki Patee, LCSW

## 2024-04-09 ENCOUNTER — Ambulatory Visit: Admitting: Psychiatry

## 2024-04-09 DIAGNOSIS — F411 Generalized anxiety disorder: Secondary | ICD-10-CM

## 2024-04-09 NOTE — Progress Notes (Signed)
 [facility] Counselor/Therapist Progress Note  Patient ID: [name], MRN: [id number],    Date: 04/09/2024  Time Spent: 53 minutes   Treatment Type: Individual Therapy  Reported Symptoms: depression, anxiety, some hopelessness but "would not harm herself nor anyone else" , anger, frustration, needing support, sadness   Mental Status Exam:  Appearance:   Neat and Well Groomed     Behavior:  Appropriate, Sharing, and Motivated  Motor:  Normal  Speech/Language:   Clear and Coherent  Affect:  Depressed and anxious  Mood:  anxious and depressed  Thought process:  goal directed  Thought content:    WNL  Sensory/Perceptual disturbances:    WNL  Orientation:  oriented to person, place, time/date, situation, day of week, month of year, year, and stated date of Apr 09, 2024  Attention:  Good  Concentration:  Good  Memory:  WNL  Fund of knowledge:   Good  Insight:    Good  Judgment:   Good  Impulse Control:  Good   Risk Assessment: Danger to Self:  No Self-injurious Behavior: No Danger to Others: No Duty to Warn:no Physical Aggression / Violence:No  Access to Firearms a concern: No  Gang Involvement:Yes   Subjective:   Patient in today participating well as she working on her anxiety and family issues with 34 yr old daughter with bipolar and schizophrenia who struggles with frequent poor decisions. Patient today reporting anxiety, anger, frustration, sadness,depression, and processing more about her kidney Stage 4 disease, and husband's alzheimer's diagnosis. Both of these are disturbing for patient and she was able to process this in more depth today in session. Adult daughter did move and is staying in [place] (has psychiatric symptoms and gets outpatient treatment.) Patient trying to maintain healthy boundaries with that daughter and is concerned about daughter's choices and lack of boundaries. Patient "super busy" and "trying to manage physical and emotional stresses,  letting some family be of support. Continues working on her sadness, anxiety and fears about her and huer husband's physical/emotional challenges. Paying attention to what helps her emotionally and what doesn't, and what does help is her son Will, prayer, her dog, her garden of flowers.  Felt good about being able to openly talk about her health concerns and her increased concerns about her kidney situation worsening.  Is meeting with her minister soon for clergy support, which I feel will be helpful for her also.  Interventions: Cognitive Behavioral Therapy and Ego-Supportive 1.Reduce overall level, frequency, and intensity of the anxiety so that daily functioning is not impaired. 2.Increase understanding of beliefs and messages that produce the worry and anxiety. 3.Identify, challenge, and replace anxious/fearful self-talk with positive, realistic, and empowering self-talk.  Diagnosis:   ICD-10-CM   1. Generalized anxiety disorder  F41.1      Plan:   Patient today working well in session as her emotions have been a little more heightened recently due to information from her doctor regarding her kidney disease gradually worsening.  Patient showed a lot of strength and effort as she talked through some of her own fears today and anxieties about the future.  Working through various emotions and also is concerned about her husband and his challenges mentally/emotionally.  Raylynn Meuser is definitely showing progress and she needs to continue working with her goal-directed behaviors to keep focused on her mental emotional health as well as her physical health as she tries to cope and make good choices.  Goal review and progress/challenges noted  with patient.  Next appointment within 1 to 2 weeks.   Kelleen Patee, LCSW

## 2024-04-10 DIAGNOSIS — H401133 Primary open-angle glaucoma, bilateral, severe stage: Secondary | ICD-10-CM | POA: Diagnosis not present

## 2024-04-10 DIAGNOSIS — H02422 Myogenic ptosis of left eyelid: Secondary | ICD-10-CM | POA: Diagnosis not present

## 2024-04-10 DIAGNOSIS — Z961 Presence of intraocular lens: Secondary | ICD-10-CM | POA: Diagnosis not present

## 2024-04-16 ENCOUNTER — Ambulatory Visit (INDEPENDENT_AMBULATORY_CARE_PROVIDER_SITE_OTHER): Admitting: Psychiatry

## 2024-04-16 DIAGNOSIS — F411 Generalized anxiety disorder: Secondary | ICD-10-CM | POA: Diagnosis not present

## 2024-04-16 NOTE — Progress Notes (Signed)
 [facility] Counselor/Therapist Progress Note  Patient ID: [name], MRN: [id number],    Date: 04/16/2024  Time Spent: 55 minutes   Treatment Type: Individual Therapy  Reported Symptoms: Anxiety, depression, admits to some hopelessness at times but commits that she would never harm herself or anyone else, anger, needing support, sadness, and frustration.    Mental Status Exam:  Appearance:   Casual     Behavior:  Appropriate, Sharing, and Motivated  Motor:  Normal  Speech/Language:   Clear and Coherent  Affect:  anxious  Mood:  anxious and depressed  Thought process:  goal directed  Thought content:    WNL  Sensory/Perceptual disturbances:    WNL  Orientation:  oriented to person, place, time/date, situation, day of week, month of year, year, and stated date of Apr 16, 2024  Attention:  Good  Concentration:  Good and Fair  Memory:  WNL  Fund of knowledge:   Good  Insight:    Good  Judgment:   Good  Impulse Control:  Good   Risk Assessment: Danger to Self:  No Self-injurious Behavior: No Danger to Others: No Duty to Warn:no Physical Aggression / Violence:No  Access to Firearms a concern: No  Gang Involvement:No   Subjective:  Patient in session today showing good participation and working further on her anxiety, depression and some anger, sadness trying to set healthier limits within the family especially regarding 25 year old daughter with bipolar and schizophrenia who struggles with substance abuse and frequent poor decisions.  Patient stressed with processing more about her kidney stage IV disease and her husband's Alzheimer's diagnosis.  Understandably both of these situations are very disturbing for patient and she processes these and other issues within the family in session today. Met with her minister this past week which was also helpful from a spiritual point of view. Continues to struggle with adult daughters's situation, continuing also to try and set  healthier boundaries with daughter. Also processing some concerns and sadness re: another grandchild living her/his father. Continuing to try and manage multiple emotional stressors within the family. (Not all details included in this note due to patient privacy needs.) Is trying to notice what is more helpful for her emotionally and what is more painful emotionally and not dwell on the "negatives" as much.  Meeting with her minister was very helpful clergy support in addition to her son will, praying, her sweet dog, her garden of flowers, and friends that are supportive.  Interventions: Cognitive Behavioral Therapy, Solution-Oriented/Positive Psychology, and Ego-Supportive 1.Reduce overall level, frequency, and intensity of the anxiety so that daily functioning is not impaired. 2.Increase understanding of beliefs and messages that produce the worry and anxiety. 3.Identify, challenge, and replace anxious/fearful self-talk with positive, realistic, and empowering self-talk.   Diagnosis:   ICD-10-CM   1. Generalized anxiety disorder  F41.1      Plan:  Patient today in session and showing a lot of motivation as she worked further on acceptance and a lot of emotions involved due to recently learning that her kidney disease is worsening and is currently at stage IV.  She is also struggling to help support husband who is diagnosed with Alzheimer's and is still quite active in some ways.  He is not able to be very emotionally supportive of patient.  Patient's recent visit with her minister was helpful in providing some spiritual grounding and support for her.  Patient continues to show a lot of strength and also compassion for  family members, including those that struggle and with which she has to set clear boundaries.  Dealing with a lot of fears and anxieties and working really well in sessions.  [name] continues to show progress and is working with her goal-directed behaviors to stay focused on her  mental/emotional health as well as her physical health and trying to cope and also make healthy choices.  Goal review and progress/challenges noted with patient.  Next appointment within 1 to 2 weeks.   Kelleen Patee, LCSW

## 2024-04-17 DIAGNOSIS — E1122 Type 2 diabetes mellitus with diabetic chronic kidney disease: Secondary | ICD-10-CM | POA: Diagnosis not present

## 2024-04-17 DIAGNOSIS — I10 Essential (primary) hypertension: Secondary | ICD-10-CM | POA: Diagnosis not present

## 2024-04-29 ENCOUNTER — Ambulatory Visit: Admitting: Psychiatry

## 2024-04-29 DIAGNOSIS — F411 Generalized anxiety disorder: Secondary | ICD-10-CM

## 2024-04-29 NOTE — Progress Notes (Deleted)
      [facility] Counselor/Therapist Progress Note  Patient ID: [name], MRN: [id number],    Date: 04/29/2024  Time Spent: ***   Treatment Type: {CHL AMB THERAPY TYPES:[id number]}  Reported Symptoms: ***  Mental Status Exam:  Appearance:   {PSY:22683}     Behavior:  {PSY:21022743}  Motor:  {PSY:22302}  Speech/Language:   {PSY:22685}  Affect:  {PSY:22687}  Mood:  {PSY:31886}  Thought process:  {PSY:31888}  Thought content:    {[phone]}  Sensory/Perceptual disturbances:    {PSY:217-400-3269}  Orientation:  {PSY:30297}  Attention:  {PSY:22877}  Concentration:  {PSY:223-715-2446}  Memory:  {PSY:419-302-9550}  Fund of knowledge:   {PSY:223-715-2446}  Insight:    {PSY:223-715-2446}  Judgment:   {PSY:223-715-2446}  Impulse Control:  {PSY:223-715-2446}   Risk Assessment: Danger to Self:  {PSY:22692} Self-injurious Behavior: {PSY:22692} Danger to Others: {PSY:22692} Duty to Warn:{PSY:311194} Physical Aggression / Violence:{PSY:21197} Access to Firearms a concern: {PSY:21197} Gang Involvement:{PSY:21197}  Subjective: ***   Interventions: {[phone]}  Diagnosis:No diagnosis found.  Plan: ***  Kelleen Patee, LCSW

## 2024-04-29 NOTE — Progress Notes (Signed)
 [facility] Counselor/Therapist Progress Note  Patient ID: [name], MRN: [id number],    Date: 04/29/2024  Time Spent: 55 minutes   Treatment Type: Individual Therapy  Reported Symptoms: anxiety, depression, some hopelessness but no thoughts of self-harm, anger, sadness, needing support, frustration   Mental Status Exam:  Appearance:   Neat     Behavior:  Appropriate, Sharing, and Motivated  Motor:  Normal  Speech/Language:   Clear and Coherent  Affect:  Appropriate  Mood:  anxious and depressed  Thought process:  goal directed  Thought content:    WNL  Sensory/Perceptual disturbances:    WNL  Orientation:  oriented to person, place, time/date, situation, day of week, month of year, year, and stated date of April 29, 2024  Attention:  Fair  Concentration:  Fair  Memory:  WNL  Fund of knowledge:   Good  Insight:    Good  Judgment:   Good  Impulse Control:  Good   Risk Assessment: Danger to Self:  No Self-injurious Behavior: No Danger to Others: No Duty to Warn:no Physical Aggression / Violence:No  Access to Firearms a concern: No  Gang Involvement:No   Subjective:  Patient today working consistently in session on her anxiety, depression, anger, and sadness as she tries to set better limits within the family particularly regarding her 48 year old daughter with bipolar and schizophrenia who continues to struggle with substance abuse and quite often impulsive and very poor decision making. Kidney stage IV disease and her husband's Alzheimers' diagnosis. Patient continues to work with and around issues concerning her own kidney health, husband's dementia (early), and her adult daughter's schizophrenia and struggle with substance abuse, impulsivity, and some poor decision-making. Very concerned and working through feelings related to her adult daughter's  bipolar/schizophrenia, husband's gradually increasing dementia, and patient's own recent diagnosis of kidney disease  stage 4. Sadness re: daughter's  and husband's issues. Has been setting healthier boundaries with adult daughter. Managing emotional stressors within family but sometimes shuts down physically and emotionally. Trying to make emotionally healthier decisions and refrain from the negatives.  Met some with minister recently and was able to receive more emotional support. Making healthier choices emotionally and trying to avoid overfocusing on the negatives.    Interventions: Cognitive Behavioral Therapy, Solution-Oriented/Positive Psychology, and Ego-Supportive 1.Reduce overall level, frequency, and intensity of the anxiety so that daily functioning is not impaired. 2.Increase understanding of beliefs and messages that produce the worry and anxiety. 3.Identify, challenge, and replace anxious/fearful self-talk with positive, realistic, and empowering self-talk.  Diagnosis:   ICD-10-CM   1. Generalized anxiety disorder  F41.1      Plan: Patient in session today showing good motivation and active participation working more on excepting a lot of her emotions involved due to recently learning from her doctor that her kidney disease is worsening and is currently at stage IV. Very open today in her processing of symptoms, problem areas for her, and concern about her recent dx of kidney disease.  Has quite a remarkable attitude and spite of her physical diagnosis and recent discussion with Dr. about being stage IV.  Continues to oversee and try to care/support her husband who has been diagnosed with Alzheimer's although is still able to be quite active in some respects but definitely needs some oversight according to patient and which sounds reasonable as she describes the home situation.  Husband is also due to his Alzheimer's, not able to be very emotionally supportive of patient which is  definitely a loss for her.  Patient shows a lot of strength, resilience, while also having a lot of compassion for family  members, some with whom she has to set clear boundaries in order to take care of her own self.  Patient continues juggling a lot of fears and anxieties and comes to sessions very motivated and ready to work on her goals/concerns.  Admits to significant anxiety and fear at times but also trying to appreciate and live the life that she has now.  Arienne Gartin continues to show progress and is working with her goal-directed behaviors trying to stay focused on her mental/emotional health as well as her physical health while making good decisions in terms of her coping, and also in making healthy choices for herself and her husband.  Goal review and progress/challenges noted with patient.  Next appointment within 1 to 2 weeks.   Kelleen Patee, LCSW

## 2024-05-01 DIAGNOSIS — E1121 Type 2 diabetes mellitus with diabetic nephropathy: Secondary | ICD-10-CM | POA: Diagnosis not present

## 2024-05-01 DIAGNOSIS — Z0000 Encounter for general adult medical examination without abnormal findings: Secondary | ICD-10-CM | POA: Diagnosis not present

## 2024-05-01 DIAGNOSIS — M810 Age-related osteoporosis without current pathological fracture: Secondary | ICD-10-CM | POA: Diagnosis not present

## 2024-05-01 DIAGNOSIS — I10 Essential (primary) hypertension: Secondary | ICD-10-CM | POA: Diagnosis not present

## 2024-05-01 DIAGNOSIS — N184 Chronic kidney disease, stage 4 (severe): Secondary | ICD-10-CM | POA: Diagnosis not present

## 2024-05-07 ENCOUNTER — Ambulatory Visit: Admitting: Psychiatry

## 2024-05-07 DIAGNOSIS — F411 Generalized anxiety disorder: Secondary | ICD-10-CM

## 2024-05-07 NOTE — Progress Notes (Signed)
 [facility] Counselor/Therapist Progress Note  Patient ID: [name], MRN: [id number],    Date: 05/07/2024  Time Spent: 55 minutes   Treatment Type: Individual Therapy  Reported Symptoms: anxiety,depression, some hopelessness but improved, anger, sadness, needing support, frustration     Mental Status Exam:  Appearance:   Casual and Neat     Behavior:  Appropriate, Sharing, and Motivated  Motor:  Normal  Speech/Language:   Clear and Coherent  Affect:  Depressed and anxious  Mood:  anxious and depressed  Thought process:  goal directed  Thought content:    WNL  Sensory/Perceptual disturbances:    WNL  Orientation:  oriented to person, place, time/date, situation, day of week, month of year, year, and stated date of May 07, 2024  Attention:  Fair  Concentration:  Fair  Memory:  WNL  Fund of knowledge:   Good  Insight:    Good  Judgment:   Good  Impulse Control:  Good   Risk Assessment: Danger to Self:  No Self-injurious Behavior: No Danger to Others: No Duty to Warn:no Physical Aggression / Violence:No  Access to Firearms a concern: No  Gang Involvement:No   Subjective:  Patient in session today working further on her anxiety, depression, anger, and sadness as she continues setting healthier limits within her family especially  for her 79 yr old daughter diagnosed with schizophrenia and bipolar and also struggles wit substance abuse, impulsivity, and poor decision-making .  Some anxiety and she has been working on managing it in healthier ways.  Also working on her anxious/fearful self talk per her treatment goals and is having some success with that.  Patient has Stage IV kidney disease and husband has Alzheimers'.  States "husband's dementia diagnosed in 2023, not much worse yet but not getting better either." Processing sadness and the uncertainty she feels under significant stress but feels she is trying to manage things effectively. Concerned about the future  as she continues progression with her kidney disease and also husband's continued progression with his Alzheimers'. Feels sad about husband and daughter and her own issues and able to express this very clearly today. Has been allowing herself to be tearful more when she is feeling sadness, but usually not around husband as he is not able to respond in healthy ways. Feeling the pressure of multiple stressors especially health-related concerns, and discussed healthy choices emotionally and within the family, and coping strategies for herself. Works really hard no not overly dwelling on "the negatives."  Interventions: Cognitive Behavioral Therapy, Solution-Oriented/Positive Psychology, and Ego-Supportive 1.Reduce overall level, frequency, and intensity of the anxiety so that daily functioning is not impaired. 2.Increase understanding of beliefs and messages that produce the worry and anxiety. 3.Identify, challenge, and replace anxious/fearful self-talk with positive, realistic, and empowering self-talk.  Diagnosis:   ICD-10-CM   1. Generalized anxiety disorder  F41.1      Plan:   Today patient in session interacting well and showing good motivation as she continues to work on her anxiety, worries, and using more positive and empowering self talk to help her navigate the difficult experiences she is going through within her family and her own health with stage IV kidney disease.  She tries to maintain a good attitude and spite of her physical problems however is able to vent well and express her anxieties and fears in our sessions together.  She is also very concerned about her husband who has Alzheimer's and will need some care into  the future as well.  Currently he is able to live in the home with patient although not very emotionally supportive nor understanding of her situation.  Patient has spoken with her minister and that was helpful.  She shows a lot of courage and strength as well as compassion and  concern for family members, while also continuing to have clear boundaries with adult daughter as necessary due to her substance abuse and other issues.  Not all details included in this note due to patient privacy needs.  Patient does admit to heightened fear and anxiety at times regarding not all of her health situation but her husband's but states that she does try to resent her herself and be very aware of her appreciation for what she does have in life.  [name] does show progress and is motivated in working with her goal-directed behaviors remaining focused on her mental/emotional health in addition to her physical health concerns, while making good decisions and efforts to cope effectively, and also making healthy choices for her husband and herself into the future.  Goal review and progress/challenges noted with patient.  Next appointment within 1 to 2 weeks.   Vicki Patee, LCSW

## 2024-05-27 ENCOUNTER — Ambulatory Visit (INDEPENDENT_AMBULATORY_CARE_PROVIDER_SITE_OTHER): Admitting: Psychiatry

## 2024-05-27 DIAGNOSIS — F411 Generalized anxiety disorder: Secondary | ICD-10-CM | POA: Diagnosis not present

## 2024-05-27 NOTE — Progress Notes (Signed)
 [facility] Counselor/Therapist Progress Note  Patient ID: [name], MRN: [id number],    Date: 05/27/2024  Time Spent: 55 minutes   Treatment Type: Individual Therapy  Reported Symptoms:    Anxiety, depression, hopelessness but improved, anger, sadness, needing support, frustrated    Mental Status Exam:  Appearance:   Neat and Well Groomed     Behavior:  Appropriate, Sharing, and Motivated  Motor:  Normal  Speech/Language:   Clear and Coherent  Affect:  Appropriate  Mood:  anxious and some depression  Thought process:  goal directed  Thought content:    WNL  Sensory/Perceptual disturbances:    WNL  Orientation:  oriented to person, place, time/date, situation, day of week, month of year, year, and stated date of May 27, 2024  Attention:  Fair  Concentration:  Good  Memory:  WNL  Fund of knowledge:   Good  Insight:    Good  Judgment:   Good  Impulse Control:  Good   Risk Assessment: Danger to Self:  No Self-injurious Behavior: No Danger to Others: No Duty to Warn:no Physical Aggression / Violence:No  Access to Firearms a concern: No  Gang Involvement:No   Subjective:   Patient today working well in session showing good motivation and active participation and working on her depression, anxiety, anger, and some sadness as she continues setting healthier limits within her family especially with her 39 year old daughter diagnosed with schizophrenia and bipolar and who also struggles with substance abuse, impulsivity, and very poor decision-making. More recently has had more difficulty shutting my brain off, especially re: regrets, and getting off to sleep. States she feels working on her goals is helping her focus more on her anxiety, looking for more positives vs negatives, and working to feel more confident in herself which we focused more on today as there is a lot going on with husband's illness, her own health concerns, and is in midst of applying to  residence at senior adult residence center that has multiple levels of care (esp regarding her husband  and his Alzheimers and his increasing medical needs). Some anxious/fearful talk especially re: her Stage IV kidney disease which is stressing her more especially as her husband's health status  is also declining.  Patient reports she is succeeding some with her work on anxious/fearful self-talk and notices the positive impact for her.  Continues to process some sadness and uncertainty that she is experiencing especially with some decline of her husband's health particularly the progression of his Alzheimer's.  Feeling some increased sadness at times and did well today in talking through these issues in session very directly.  Has a realistic view and is taking some good steps to help ensure she and her husband have the help they need.  She continues to work hard at not overly focusing on the negatives and trying to have a healthier balance for herself as she tries to make good decisions for herself and her husband going forward.  Interventions: Cognitive Behavioral Therapy, Solution-Oriented/Positive Psychology, and Ego-Supportive 1.Reduce overall level, frequency, and intensity of the anxiety so that daily functioning is not impaired. 2.Increase understanding of beliefs and messages that produce the worry and anxiety. 3.Identify, challenge, and replace anxious/fearful self-talk with positive, realistic, and empowering self-talk.  Diagnosis:   ICD-10-CM   1. Generalized anxiety disorder  F41.1      Plan:    Patient in session today showing good motivation in working further on her anxiety, worries, and use  of more positive and empowering self talk to help patient navigate the difficult experiences she is going through with her family and also in her own health challenges with stage IV kidney disease.  Showed really good effort and strength in session today as she is confronting significant issues  within her family and some decline within her husband's health status.  (Not all details included in this note due to patient privacy needs.) admits to some fears and concerns about the future medically but balances that out with some good insight and strength in the present.  As noted above, she continues to be very concerned about her husband who has Alzheimer's and who is being tested further about additional diagnosis soon.  Patient shows a lot of of compassion, strength, encourage and does acknowledge at times that it is a lot and is able to talk about the stress of family situations more directly and at length today in session.  Finds the support very helpful and continues to work towards making healthy decisions and being able to cope effectively with challenges particularly in her family and extended family.  [name] has definitely made progress and she remains motivated and working on her goal-directed behaviors focused on her mental/emotional health in addition to her increased physical health concerns.   Goal review and progress/challenges noted with patient.  Next appointment within 1 to 2 weeks.   Barnie Bunde, LCSW

## 2024-06-10 DIAGNOSIS — N184 Chronic kidney disease, stage 4 (severe): Secondary | ICD-10-CM | POA: Diagnosis not present

## 2024-06-10 DIAGNOSIS — D649 Anemia, unspecified: Secondary | ICD-10-CM | POA: Diagnosis not present

## 2024-06-10 DIAGNOSIS — N2581 Secondary hyperparathyroidism of renal origin: Secondary | ICD-10-CM | POA: Diagnosis not present

## 2024-06-10 DIAGNOSIS — I129 Hypertensive chronic kidney disease with stage 1 through stage 4 chronic kidney disease, or unspecified chronic kidney disease: Secondary | ICD-10-CM | POA: Diagnosis not present

## 2024-06-10 DIAGNOSIS — E1122 Type 2 diabetes mellitus with diabetic chronic kidney disease: Secondary | ICD-10-CM | POA: Diagnosis not present

## 2024-06-11 ENCOUNTER — Ambulatory Visit (INDEPENDENT_AMBULATORY_CARE_PROVIDER_SITE_OTHER): Admitting: Psychiatry

## 2024-06-11 DIAGNOSIS — F411 Generalized anxiety disorder: Secondary | ICD-10-CM

## 2024-06-11 NOTE — Progress Notes (Signed)
 [facility] Counselor/Therapist Progress Note  Patient ID: CELESTE TAVENNER, MRN: [id number],    Date: 06/11/2024  Time Spent: 55 minutes   Treatment Type: Individual Therapy  Reported Symptoms: anxiety, depression, hopeless improved a little, anger, sadness, frustrated, needing support    Mental Status Exam:  Appearance:   Casual and Neat     Behavior:  Appropriate, Sharing, and Motivated  Motor:  Normal  Speech/Language:   Clear and Coherent  Affect:  Anxious, some depression  Mood:  anxious, depressed, and sad  Thought process:  goal directed  Thought content:    Rumination  Sensory/Perceptual disturbances:    WNL  Orientation:  oriented to person, place, time/date, situation, day of week, month of year, year, and stated date of June 11, 2024  Attention:  Fair  Concentration:  Fair  Memory:  WNL  Fund of knowledge:   Good  Insight:    Good  Judgment:   Good  Impulse Control:  Good   Risk Assessment: Danger to Self:  No Self-injurious Behavior: No Danger to Others: No Duty to Warn:no Physical Aggression / Violence:No  Access to Firearms a concern: No  Gang Involvement:No   Subjective:   Patient in session today and working further on her depression, anxiety, anger, sadness, some hopelessness and frustration. Denies and SI. Patient states yesterday was a really bad day, and today is not quite as bad.. Daughter arrested in [place] for 2 charges. (Not all details included in this note due to patient privacy needs.) Patient needing to talk through the situation about her daughter in session today.  Frustration, sadness, hurt, anger, depressed. Met with her kidney doctor's nurse recently. Continued concern and stressors for patient and some of her family. Son is a real support for patient and husband (Alz. patient).  Brief verbal conflict with husband a few days ago shared and processed. Husband's physical status has decreased some, and patient is concerned. Processing  some of her concerns  in more detail about the future for she and her husband as they make decisions about next steps. Discussed her need to take good care of her own self as she helps care for husband.  Working on better managing her anxious/fearful self talk and not making assumptions too quickly.  Is feeling more sadness at times and states that coming and talking through so much of what is going on in her life and the life of her husband is helpful to her in putting things in perspective and taking each day as it comes.  She continues seeing her doctor regarding her stage IV kidney disease.  Trying not to assume the negatives and trying to keep a healthy balance of time alone when she needs it and also time with supportive family and others that care about her and her husband.   Interventions: Cognitive Behavioral Therapy, Solution-Oriented/Positive Psychology, and Ego-Supportive 1.Reduce overall level, frequency, and intensity of the anxiety so that daily functioning is not impaired. 2.Increase understanding of beliefs and messages that produce the worry and anxiety. 3.Identify, challenge, and replace anxious/fearful self-talk with positive, realistic, and empowering self-talk.   Diagnosis:   ICD-10-CM   1. Generalized anxiety disorder  F41.1      Plan:   Patient working in session today doing really good motivation and perseverance as she continued working on her anxiety, some depression, worries, sadness, and frustrations related to her own stress and her care for her husband with Alzheimer's as they consider next steps in  possibly moving to an adult retirement community that can help cover the needs of her husband and that they can both feel like it is a good decision.  (Not all details included in this note due to patient privacy needs.) Earnest Thalman is making progress and remains very motivated in working on her goal-directed behaviors focused on her mental/emotional health in addition to  her increased physical health concerns.  Goal review and progress/challenges noted with patient.  Next appointment within 1 to 2 weeks.   Barnie Bunde, LCSW

## 2024-06-19 ENCOUNTER — Ambulatory Visit (INDEPENDENT_AMBULATORY_CARE_PROVIDER_SITE_OTHER): Admitting: Psychiatry

## 2024-06-19 DIAGNOSIS — F411 Generalized anxiety disorder: Secondary | ICD-10-CM | POA: Diagnosis not present

## 2024-06-19 NOTE — Progress Notes (Signed)
 [facility] Counselor/Therapist Progress Note  Patient ID: [name], MRN: [id number],    Date: 06/19/2024  Time Spent: 53 minutes   Treatment Type: Individual Therapy  Reported Symptoms: anxiety, depression , hopelessness improved some, anger, sadness, frustrated, needing support   Mental Status Exam:  Appearance:   Casual and Neat     Behavior:  Appropriate, Sharing, and Motivated  Motor:  Normal  Speech/Language:   Clear and Coherent  Affect:  Depressed and anxious  Mood:  anxious and depressed  Thought process:  goal directed  Thought content:    WNL  Sensory/Perceptual disturbances:    WNL  Orientation:  oriented to person, place, time/date, situation, day of week, month of year, year, and stated date of June 19, 2024  Attention:  Good  Concentration:  Good  Memory:  WNL  Fund of knowledge:   Good  Insight:    Good  Judgment:   Good  Impulse Control:  Good   Risk Assessment: Danger to Self:  No Self-injurious Behavior: No Danger to Others: No Duty to Warn:no Physical Aggression / Violence:No  Access to Firearms a concern: No  Gang Involvement:No   Subjective:  Patient today in session and continuing to work on her depression, anxiety, sadness, anger, some hopelessness, frustration, and needing support. Waiting for some test results on husband including Alzheimer's, and Lewy body disease which should be back soon. Daughter still in jail.  Patient did session today to talk through husband's situation and daughter's situation in detail and receive support.  She does have an adult son who is also very supportive of her and lives locally.  Patient trying to manage her frustration, some anger, some depression and anxiety, and sadness with all that is going on with her husband's health declining and adult daughter in jail again and being held for now.  Patient very appreciative of the time today and was able to process further some of her additional concerns more  recently and concerns for the future for she and her husband.  They are in communication with some options for them to possibly move into a long-term care environment where patient can be amongst other healthier senior adults in 1 location on the campus and husband can be cared for due to his declining health and memory issues and another part of campus but would still be close by to his wife and convenient for them to have contact together and particularly the wife be able to visit him.  No decisions being made but patient realizes with husband's decline and some other issues involved that she is trying to think about the future more at this point.  Still do not know some results regarding his physical testing and hoping to hear something within a week.  Some sadness at times and has some good coping skills as well as good friendships and family members to support.  Reviewed the need for patient to take good care of her own self and her needs as she helps care for her husband.  She continues to see her medical doctor regarding stage IV kidney disease and seems to be doing rather well right now.  Very active.  Refraining from assuming the negatives and is maintaining a healthier balance of time where she has some time with supportive family members and other times when it is primarily she and her husband together.  Is concerned about adult daughter for the long-term.  Interventions: Cognitive Behavioral Therapy, Motivational Interviewing, and  Ego-Supportive 1.Reduce overall level, frequency, and intensity of the anxiety so that daily functioning is not impaired. 2.Increase understanding of beliefs and messages that produce the worry and anxiety. 3.Identify, challenge, and replace anxious/fearful self-talk with positive, realistic, and empowering self-talk.   Diagnosis:   ICD-10-CM   1. Generalized anxiety disorder  F41.1      Plan:  Patient continues working on her anxiety, worries, sadness, depression,  and frustrations mostly related to her own stressors and concern for her husband with Alzheimer's as they are negotiating possible next steps including an eventual adult retirement community that also has medical units that would be appropriate for her husband.  (Not all details included in this note due to patient privacy leads).  Patient [name] continues to make progress and remains very motivated and actively working on her goal-directed behaviors as she focuses on her mental/emotional health in addition to her increased physical health concerns and concerns for her husband and his health issues.  Will review and progress/challenges noted with patient.  Next appointment within 1 to 2 weeks.   Barnie Bunde, LCSW

## 2024-06-25 DIAGNOSIS — D2239 Melanocytic nevi of other parts of face: Secondary | ICD-10-CM | POA: Diagnosis not present

## 2024-06-25 DIAGNOSIS — D692 Other nonthrombocytopenic purpura: Secondary | ICD-10-CM | POA: Diagnosis not present

## 2024-06-25 DIAGNOSIS — L570 Actinic keratosis: Secondary | ICD-10-CM | POA: Diagnosis not present

## 2024-06-25 DIAGNOSIS — L918 Other hypertrophic disorders of the skin: Secondary | ICD-10-CM | POA: Diagnosis not present

## 2024-06-27 ENCOUNTER — Ambulatory Visit (INDEPENDENT_AMBULATORY_CARE_PROVIDER_SITE_OTHER): Admitting: Psychiatry

## 2024-06-27 DIAGNOSIS — F411 Generalized anxiety disorder: Secondary | ICD-10-CM | POA: Diagnosis not present

## 2024-06-27 NOTE — Progress Notes (Signed)
 [facility] Counselor/Therapist Progress Note  Patient ID: [name], MRN: [id number],    Date: 06/27/2024  Time Spent: 55 minutes   Treatment Type: Individual Therapy  Reported Symptoms: anxiety, depression, hopelessness improving, anger, frustration, needing support    Mental Status Exam:  Appearance:   Casual and Neat     Behavior:  Appropriate, Sharing, and Motivated  Motor:  Normal  Speech/Language:   Clear and Coherent  Affect:  Depressed and anxious  Mood:  anxious and depressed  Thought process:  goal directed  Thought content:    WNL  Sensory/Perceptual disturbances:    WNL  Orientation:  oriented to person, place, time/date, situation, day of week, month of year, year, and stated date of June 27, 2024  Attention:  Good  Concentration:  Good  Memory:  WNL  Fund of knowledge:   Good  Insight:    Good  Judgment:   Good  Impulse Control:  Good   Risk Assessment: Danger to Self:  No Self-injurious Behavior: No Danger to Others: No Duty to Warn:no Physical Aggression / Violence:No  Access to Firearms a concern: No  Gang Involvement:No   Subjective:  Patient in session today working on her anxiety, depression, anger, sadness, frustration, some hopelessness, needing support and needs to feel heard. Still waiting to  hear the result of additional testing for her husband recently re: Lewy body disease, and he already is diagnosed with Alzheimer's. Daughter still in jail awaiting court. Patient stressed in trying to make necessary arrangements/decisions for husband. Adult son is very supporting of patient and husband and lives nearby. Appreciative of session today to continue processing concerns for husband's medical issues. May be closer to needing placement for husband, not sure. Trying to manage her frustration in calmer ways. Also working to better manage her depression, anxiety, some anger and sadness due to the progression of her husband's Alzheimers and  possible Lewy body disease (waiting to hear from recent testing).  Is in communication with a long-term care environment where patient has already been checking out knowing she and husband were going to have needs into the future, particularly with husband's health decline.  No decisions have been made yet as far as they are moving but patient is having contacts and appointments to check this out.  Still waiting to hear back from husband's physical testing about whether or not he has Lewy body disease.  Working today on her coping skills and she is very open and sharing her sadness and stress which helps her to get the support she needs.  Patient herself has stage IV kidney disease and is holding my own right now.  She continues to be quite active as much as she is able.  Tries to not assume negatives and maintain a balance between meeting her husband's needs, her needs, and being in touch with supportive family members.  Adult daughter remains in jail and patient is concerned about her but also sets healthy boundaries.   Interventions: Cognitive Behavioral Therapy, Solution-Oriented/Positive Psychology, and Ego-Supportive 1.Reduce overall level, frequency, and intensity of the anxiety so that daily functioning is not impaired. 2.Increase understanding of beliefs and messages that produce the worry and anxiety. 3.Identify, challenge, and replace anxious/fearful self-talk with positive, realistic, and empowering self-talk.    Diagnosis:   ICD-10-CM   1. Generalized anxiety disorder  F41.1      Plan: Patient today working further on her anxiety, sadness, depression, worries, and frustrations related mostly to her  own stressors and concern for her husband with Alzheimer's and his symptoms are worsening. Patient needing to plan ahead for next steps for husband and his care, which she she is in the midst of now.  Patient [name] has made progress and remains very motivated and actively working on  her goal-directed behaviors as she focuses on her mental/emotional health in addition to her increased physical health concerns and concerns for her husband and his significant health issues that are increasing.  Goal review and progress/challenges noted with patient.  Next appointment within 1 to 2 weeks.   Barnie Bunde, LCSW

## 2024-07-11 DIAGNOSIS — H401133 Primary open-angle glaucoma, bilateral, severe stage: Secondary | ICD-10-CM | POA: Diagnosis not present

## 2024-07-22 DIAGNOSIS — Z961 Presence of intraocular lens: Secondary | ICD-10-CM | POA: Diagnosis not present

## 2024-07-22 DIAGNOSIS — H02422 Myogenic ptosis of left eyelid: Secondary | ICD-10-CM | POA: Diagnosis not present

## 2024-07-22 DIAGNOSIS — H401133 Primary open-angle glaucoma, bilateral, severe stage: Secondary | ICD-10-CM | POA: Diagnosis not present

## 2024-07-23 ENCOUNTER — Ambulatory Visit (INDEPENDENT_AMBULATORY_CARE_PROVIDER_SITE_OTHER): Admitting: Psychiatry

## 2024-07-23 DIAGNOSIS — F411 Generalized anxiety disorder: Secondary | ICD-10-CM

## 2024-07-23 NOTE — Progress Notes (Signed)
 [facility] Counselor/Therapist Progress Note  Patient ID: [name], MRN: [id number],    Date: 07/23/2024  Time Spent: 55 minutes   Treatment Type: Individual Therapy  Reported Symptoms:  anxiety, depression, hopelessness but improving some, frustration, needing support    Mental Status Exam:  Appearance:   Casual and Well Groomed     Behavior:  Appropriate, Sharing, and Motivated  Motor:  Normal  Speech/Language:   Clear and Coherent  Affect:  Depressed and anxiety  Mood:  anxious and depressed  Thought process:  goal directed  Thought content:    WNL  Sensory/Perceptual disturbances:    WNL  Orientation:  oriented to person, place, time/date, situation, day of week, month of year, year, and stated date of July 23, 2024  Attention:  Fair  Concentration:  Good  Memory:  WNL  Fund of knowledge:   Good  Insight:    Good  Judgment:   Good  Impulse Control:  Good   Risk Assessment: Danger to Self:  No Self-injurious Behavior: No Danger to Others: No Duty to Warn:no Physical Aggression / Violence:No  Access to Firearms a concern: No  Gang Involvement:No   Subjective:   Patient in for her session today and continues working on anxiety, anger, depression, sadness, frustration, some hopelessness, and needing support and to feel heard. Feels she is doing as well as possible right now considering her stressors and circumstances.  Some sadness, anxiety, depression, and frustration regarding husband's declining health and what the future may help improve them.  His Lewy body disease test came back okay and that he does not have that health issue, so patient was very grateful for that.  Adult son remains very supportive and lives nearby.  Patient seemed to really appreciate being able to talk through her concerns and anxieties today in session and was much calmer by the end of session.  Less tearfulness towards end of session and actually able to laugh appropriately at a  couple of things that she spoke about.  She continues to be in communication with a few different long-term care facilities that she and husband are checking out, primarily patient doing most of this.  They have not made decisions yet but are narrowing their focus.  Trying not to assume negatives, while maintaining balance between taking care of her husband's needs, her own needs, and being in touch with supportive family members and friends.  Patient reports that adult daughter is now out of jail and that it is very important for patient to maintain healthy boundaries with her, which was discussed further in session today.   Interventions: Cognitive Behavioral Therapy, Solution-Oriented/Positive Psychology, and Ego-Supportive 1.Reduce overall level, frequency, and intensity of the anxiety so that daily functioning is not impaired. 2.Increase understanding of beliefs and messages that produce the worry and anxiety. 3.Identify, challenge, and replace anxious/fearful self-talk with positive, realistic, and empowering self-talk.    Diagnosis:   ICD-10-CM   1. Generalized anxiety disorder  F41.1      Plan:   Patient focusing further today on her anxiety, sadness, worries, depression, and frustrations related to stressors with husband who has Alzheimer's and with her own stressors.  Husband's Alzheimer's symptoms have worsened some recently and patient is concerned. Is checking out some options especially for their eventual care and living situation. Today needed the time to share and work through some of the decline she is seeing in her husband, mentally/emotionally/and physically. Very sad in seeing her husband  decline with his Alzheimer's and some confusion and today was able to talk through some of her grief, sadness, and questions that she has about the future.  Uncertain right now of what their plans will be as far as moving into some type of longer-term care facility that can meet his needs, and  patient is at least checking out some of these options currently.  This is sad for patient however she is also showing a lot of strength in the midst of her own sadness and watching her husband decline.  [name] continues to make progress and remains very motivated and actively working on her goal-directed behaviors as she focuses on helping her husband get the mental/emotional health care that he needs in addition to taking care of her own self as husbands health issues are increasing.  Goal review and progress/challenges noted with patient.  Next appointment within 2 weeks.   Barnie Bunde, LCSW

## 2024-07-30 DIAGNOSIS — N184 Chronic kidney disease, stage 4 (severe): Secondary | ICD-10-CM | POA: Diagnosis not present

## 2024-07-31 DIAGNOSIS — E1122 Type 2 diabetes mellitus with diabetic chronic kidney disease: Secondary | ICD-10-CM | POA: Diagnosis not present

## 2024-07-31 DIAGNOSIS — I129 Hypertensive chronic kidney disease with stage 1 through stage 4 chronic kidney disease, or unspecified chronic kidney disease: Secondary | ICD-10-CM | POA: Diagnosis not present

## 2024-07-31 DIAGNOSIS — N184 Chronic kidney disease, stage 4 (severe): Secondary | ICD-10-CM | POA: Diagnosis not present

## 2024-07-31 DIAGNOSIS — D649 Anemia, unspecified: Secondary | ICD-10-CM | POA: Diagnosis not present

## 2024-08-08 ENCOUNTER — Ambulatory Visit (INDEPENDENT_AMBULATORY_CARE_PROVIDER_SITE_OTHER): Admitting: Psychiatry

## 2024-08-08 DIAGNOSIS — H401133 Primary open-angle glaucoma, bilateral, severe stage: Secondary | ICD-10-CM | POA: Diagnosis not present

## 2024-08-08 DIAGNOSIS — F411 Generalized anxiety disorder: Secondary | ICD-10-CM

## 2024-08-08 NOTE — Progress Notes (Signed)
      [facility] Counselor/Therapist Progress Note  Patient ID: MALYIA MORO, MRN: [id number],    Date: 08/08/2024  Time Spent: 53 minutes  Treatment Type: Individual Therapy  Reported Symptoms: Anxiety, depression, frustration, needing support, hopelessness but improving some    Mental Status Exam:  Appearance:   Casual and Neat     Behavior:  Appropriate, Sharing, and Motivated  Motor:  Normal  Speech/Language:   Clear and Coherent  Affect:  Depressed and anxiety  Mood:  anxious and depressed  Thought process:  goal directed  Thought content:    WNL  Sensory/Perceptual disturbances:    WNL  Orientation:  oriented to person, place, time/date, situation, day of week, month of year, year, and stated date of Sept 11, 2025  Attention:  Good  Concentration:  Good  Memory:  WNL  Fund of knowledge:   Good  Insight:    Good  Judgment:   Good  Impulse Control:  Good   Risk Assessment: Danger to Self:  No Self-injurious Behavior: No Danger to Others: No Duty to Warn:no Physical Aggression / Violence:No  Access to Firearms a concern: No  Gang Involvement:No   Subjective: Patient today in session working further on her anxiety/depression/stress/sadness/frustration related to current life stressors including retirement, husband's health concern and decline, adult daughter with substance abuse and mental health issues.  Feeling a lot of gray in my life right how. Did some good journaling between sessions and shared it, which seemed really helpful to patient. Feeling more alone in her journey and making decisions for her and her husband. Life changes are growing. Processed her journaling and the current life changes and into the near future, which seemed helpful to patient. Husband's declining health becoming a real factor in their decision-making. Adult son still very supportive. Seemed relieved to be talking through these stressors right now. Decreased tearfulness and really tries  to connect with the positives.  Adult daughter currently out of jail and patient continues to try and maintain healthy boundaries.  Interventions: Cognitive Behavioral Therapy, Solution-Oriented/Positive Psychology, and Ego-Supportive 1.Reduce overall level, frequency, and intensity of the anxiety so that daily functioning is not impaired. 2.Increase understanding of beliefs and messages that produce the worry and anxiety. 3.Identify, challenge, and replace anxious/fearful self-talk with positive, realistic, and empowering self-talk.   Diagnosis:   ICD-10-CM   1. Generalized anxiety disorder  F41.1      Plan: Patient actively working in session today, very focused in working on her worrying, anxiety, depression, frustrations, and sadness.  Showing a lot of strength as she is trying to make decisions for her and her husband with Alzheimer's symptoms.  Is checking out alternative living situations where they could live in retirement and he eventually get the care that he may need as his Alzheimer's progresses.  Sad for patient but she feels very fortunate to be able to come and talk through her sadness, fears, and anxieties as well as experience some grounded nests and hope.  Edna Rede continues to make progress and remains very motivated and actively working on her goal-directed behaviors, as she focuses on helping her husband get the mental/emotional health care that he needs in addition to taking care of her own self while her husband's health issues are increasing.  Goal review and progress/challenges noted with patient.  Next appointment within 2 weeks.   Barnie Bunde, LCSW

## 2024-08-21 DIAGNOSIS — H02422 Myogenic ptosis of left eyelid: Secondary | ICD-10-CM | POA: Diagnosis not present

## 2024-08-21 DIAGNOSIS — H401133 Primary open-angle glaucoma, bilateral, severe stage: Secondary | ICD-10-CM | POA: Diagnosis not present

## 2024-08-21 DIAGNOSIS — Z961 Presence of intraocular lens: Secondary | ICD-10-CM | POA: Diagnosis not present

## 2024-08-22 ENCOUNTER — Ambulatory Visit (INDEPENDENT_AMBULATORY_CARE_PROVIDER_SITE_OTHER): Admitting: Psychiatry

## 2024-08-22 DIAGNOSIS — F411 Generalized anxiety disorder: Secondary | ICD-10-CM

## 2024-08-22 NOTE — Progress Notes (Signed)
 [facility] Counselor/Therapist Progress Note  Patient ID: [name], MRN: [id number],    Date: 08/22/2024  Time Spent: 55 minutes   Treatment Type: Individual Therapy  Reported Symptoms: anxiety, depression, hopelessness improved, worries   Mental Status Exam:   Appearance:   Casual and Neat     Behavior:  Appropriate, Sharing, and Motivated  Motor:  Normal  Speech/Language:   Clear and Coherent  Affect:  anxious  Mood:  anxious, depressed, and trying to focus more on what I can do versus cannot do.  Thought process:  goal directed  Thought content:    WNL  Sensory/Perceptual disturbances:    WNL  Orientation:  oriented to person, place, time/date, situation, day of week, month of year, year, and stated date of Sept. 25, 2025  Attention:  Good  Concentration:  Good  Memory:  WNL  Fund of knowledge:   Good  Insight:    Good  Judgment:   Good  Impulse Control:  Good   Risk Assessment: Danger to Self:  No Self-injurious Behavior: No Danger to Others: No Duty to Warn:no Physical Aggression / Violence:No  Access to Firearms a concern: No  Gang Involvement:No   Subjective:   Patient working today in session more on her depression/sadness/anxiety/frustrations related to stressors she is currently experiencing regarding decision-making, husband's health concerns and decline, retirement, and adult daughter with substance abuse and mental health issues. Husband's health continues to decline mentally and physically and patient working through a lot of stress, uncertainty with husband's health issues.  Does have supportive relationships outside of the home and seems to really appreciate, and in talking through difficult and challenging issues within the family and how she is impacted by them.  Does have some hopefulness about the future and being able to eventually get into a living situation that will be better for her and her husband as his illness progresses.  Sad and  frustrated at seeing husbands mental decline.  Life changes continue.  Does some occasional journaling which is helpful, stays in contact with supportive family members and friends.  Seems relieved when she is able to talk through circumstances in a confidential environment.  No tearfulness today and she was definitely connecting more with some positives and progress being made on checking out resources for the future for she and her husband.  Interventions: Cognitive Behavioral Therapy, Solution-Oriented/Positive Psychology, and Ego-Supportive 1.Reduce overall level, frequency, and intensity of the anxiety so that daily functioning is not impaired. 2.Increase understanding of beliefs and messages that produce the worry and anxiety. 3.Identify, challenge, and replace anxious/fearful self-talk with positive, realistic, and empowering self-talk.  Diagnosis:   ICD-10-CM   1. Generalized anxiety disorder  F41.1      Plan: Patient today working well in session and very focused on her current situation with husband's illness and trying to plan ahead for their needs into the future.  Still has some worrying, anxiety, depression, and sadness at times however today was less sad and shared more of her sense of humor which I thought was healthy for her.  Seeing the mental and emotional decline and her husband more but also trying to stay as active as possible and include him as much as possible in relationships and in talking about now and talking about the future.  Due to his memory issues he is not able to retain as well as he has in the past.  Patient always very thankful to be able to come  and talk through her fears, anxieties, some sadness at times, and also try to feel more grounded in the midst of changes regarding her husband's health and his needs.  Goal review and progress/challenges noted with patient.  Next appointment within 2 weeks.   Barnie Bunde, LCSW

## 2024-08-29 ENCOUNTER — Ambulatory Visit: Admitting: Psychiatry

## 2024-08-29 DIAGNOSIS — F411 Generalized anxiety disorder: Secondary | ICD-10-CM | POA: Diagnosis not present

## 2024-08-29 NOTE — Progress Notes (Signed)
 [facility] Counselor/Therapist Progress Note  Patient ID: [name], MRN: [id number],    Date: 08/29/2024  Time Spent: 55 minutes   Treatment Type: Individual Therapy  Reported Symptoms:   Anxiety, depression, hopelessness improving, worries     Mental Status Exam:  Appearance:   Casual and Neat     Behavior:  Appropriate, Sharing, and Motivated  Motor:  Normal  Speech/Language:   Clear and Coherent  Affect:  Depressed and anxiety  Mood:  anxious and depressed  Thought process:  goal directed  Thought content:    WNL  Sensory/Perceptual disturbances:    WNL  Orientation:  oriented to person, place, time/date, situation, day of week, month of year, year, and stated date of Oct. 2, 2025  Attention:  Good  Concentration:  Good  Memory:  WNL  Fund of knowledge:   Good  Insight:    Good  Judgment:   Good  Impulse Control:  Good   Risk Assessment: Danger to Self:  No Self-injurious Behavior: No Danger to Others: No Duty to Warn:no Physical Aggression / Violence:No  Access to Firearms a concern: No  Gang Involvement:No   Subjective:  Patient in session today focusing further on her anxiety, depression, sadness, frustrations all relate to stressors about her husband's health concerns and decline, decision-making, retirement, and adult daughter with substance abuse and mental health issues.  Also doing some problem-solving re: husband's health decline and adult daughter (substance abuse and legal issues). Husband is declining in his health (mental, physical, and emotional).  Patient needing support as she continues to get caught up at times amongst family members, sometimes involving poor choices and/or conflict.  Has good support outside of the home which patient really appreciates and does well in sessions in unpacking and sorting through a lot of issues that she faces.  She does maintain a sense of hopefulness about the future, while understanding that she cannot  control other family members and make them use good judgment in their actions.  Continues to try and have good boundaries in place with particular people in her life where boundaries are critical.  (Not all details included in this note due to patient privacy needs.) still working on some future situations for she and her husband in terms of eventually being in some type of community housing that can help accommodate her husband and his health challenges.  Also using journaling as a tool which she finds helpful.  States that it is a relief and being able to talk through and share her concerns in a confidential environment, and where she does not feel judged.   Interventions: Cognitive Behavioral Therapy, Solution-Oriented/Positive Psychology, and Ego-Supportive 1.Reduce overall level, frequency, and intensity of the anxiety so that daily functioning is not impaired. 2.Increase understanding of beliefs and messages that produce the worry and anxiety. 3.Identify, challenge, and replace anxious/fearful self-talk with positive, realistic, and empowering self-talk.   Diagnosis:   ICD-10-CM   1. Generalized anxiety disorder  F41.1      Plan:  Patient today worked well in session focusing more on some challenges with her husband's illness and some plans they are trying to work ahead on for the future and other housing situation that might be more conducive for them.  Worrying, some depression, anxiety, and sadness at times but is remaining very motivated and is very appreciative of any support and help when she comes in for sessions.  Husband's memory issues continue but patient is happy that  they can spend more time together doing things that they both enjoy even though his memory is not as intact.  Goal review and progress/challenges noted with patient.  Next appointment within 2 weeks.   Barnie Bunde, LCSW

## 2024-09-04 DIAGNOSIS — Z1231 Encounter for screening mammogram for malignant neoplasm of breast: Secondary | ICD-10-CM | POA: Diagnosis not present

## 2024-09-05 ENCOUNTER — Ambulatory Visit (INDEPENDENT_AMBULATORY_CARE_PROVIDER_SITE_OTHER): Admitting: Psychiatry

## 2024-09-05 DIAGNOSIS — F411 Generalized anxiety disorder: Secondary | ICD-10-CM

## 2024-09-05 NOTE — Progress Notes (Signed)
 [facility] Counselor/Therapist Progress Note  Patient ID: [name], MRN: [id number],    Date: 09/05/2024  Time Spent: 58 minutes   Treatment Type: Individual Therapy  Reported Symptoms: anxiety, depression, hopelessness improving, worries, stressed re: upcoming holiday family/personal plans    Mental Status Exam:  Appearance:   Casual and Neat     Behavior:  Appropriate, Sharing, and Motivated  Motor:  Normal  Speech/Language:   Clear and Coherent  Affect:  Depressed and anxious  Mood:  anxious and depressed  Thought process:  goal directed  Thought content:    WNL  Sensory/Perceptual disturbances:    WNL  Orientation:  oriented to person, place, time/date, situation, day of week, month of year, year, and stated date of Oct. 9, 2025  Attention:  Good  Concentration:  Good  Memory:  WNL  Fund of knowledge:   Good  Insight:    Good  Judgment:   Good  Impulse Control:  Good   Risk Assessment: Danger to Self:  No Self-injurious Behavior: No Danger to Others: No Duty to Warn:no Physical Aggression / Violence:No  Access to Firearms a concern: No  Gang Involvement:No   Subjective:   Patient in session reporting she has held steady since last appt and trying to be more patient with husband's medical condition, and noticing some additional progression in his symptoms with his Alzheimer's.  Patient's symptoms include: anxiety, hopelessness decreasing, some depression and sadness, and worrying about the future.  Denies any thoughts of self-harm.  Did really well today in session talking through her anxiety, depression, sadness, worries about the future, and feels good about the decrease in hopeless feelings.  Confronting issues related to her symptoms above and also her husband's health concerns and some gradual decline, making decisions for herself and husband as far as long-term care as needed, retirement issues, and adult daughter with mental health issues and  continuing to hold onto her boundaries with daughter.  Patient really appreciate and the support as she can easily get pulled by her adult daughter and has worked on having appropriate boundaries with her and talked about this further today.  Has good support within the family outside of the home.  Overall does feel a sense of hopefulness for the future and trying to maintain a sense of calmness and dealing with adult daughter and having healthy boundaries, as well as the decline of her husband's health.  Today in session needed to unpack verbally more of her concerns about the future but also having hope for the future.  Uses journaling as a tool at times which she finds helpful.  States that she wants to be able to bring in some of her journaling next session including our HIT list which is something we refer to as the main issues for that session that we want a make sure we work on.    Interventions: Cognitive Behavioral Therapy, Solution-Oriented/Positive Psychology, and Ego-Supportive 1.Reduce overall level, frequency, and intensity of the anxiety so that daily functioning is not impaired. 2.Increase understanding of beliefs and messages that produce the worry and anxiety. 3.Identify, challenge, and replace anxious/fearful self-talk with positive, realistic, and empowering self-talk.    Diagnosis:   ICD-10-CM   1. Generalized anxiety disorder  F41.1      Plan: Patient today working well in session as she continues to support her husband with his Alzheimer's and set appropriate boundaries with her adult daughter with mental/emotional issues and substance abuse concerns.  Worked  further today on some of her depression, hopelessness which is decreasing, anxiety, sadness, worries about the future, but also noting some of her strengths, resources of support, and some of her resilience.  Patient thankful that even though her husband's memory issues are present, she is thankful that they have time  together and they are able to enjoy some of the same things even though his memory has changed and will likely continue to change.  Goal review and progress/challenges noted with patient.  Next appointment within 2 weeks.   Barnie Bunde, LCSW

## 2024-09-11 ENCOUNTER — Ambulatory Visit: Admitting: Psychiatry

## 2024-09-11 DIAGNOSIS — F411 Generalized anxiety disorder: Secondary | ICD-10-CM | POA: Diagnosis not present

## 2024-09-11 NOTE — Progress Notes (Signed)
 [facility] Counselor/Therapist Progress Note  Patient ID: [name], MRN: [id number],    Date: 09/11/2024  Time Spent: 53 minutes   Treatment Type: Individual Therapy  Reported Symptoms:  anxiety, depression, hopelessness improving, stressor re: upcoming extended family holiday plans/personal plans   Mental Status Exam:  Appearance:   Casual and Neat     Behavior:  Appropriate, Sharing, and Motivated  Motor:  Normal  Speech/Language:   Clear and Coherent  Affect:  Anxious, some depression  Mood:  anxious and depressed  Thought process:  goal directed  Thought content:    WNL  Sensory/Perceptual disturbances:    WNL  Orientation:  oriented to person, place, time/date, situation, day of week, month of year, year, and stated date of Oct. 15, 2025  Attention:  Good  Concentration:  Good  Memory:  WNL  Fund of knowledge:   Good  Insight:    Good  Judgment:   Good  Impulse Control:  Good   Risk Assessment: Danger to Self:  No Self-injurious Behavior: No Danger to Others: No Duty to Warn:no Physical Aggression / Violence:No  Access to Firearms a concern: No  Gang Involvement:No   Subjective:  Patient today reporting stress, hopelessness decreased, anxiety related to personal and family challenges, some more frequent sadness re: husband's further decline, and is concerned about upcoming holiday family plans due to different personalities. Noticing some increase in husband's physical /emotional symptoms with his Alzheimers and processed this in more detail today's session. She is concerned about his symptoms including more forgetfulness, hard to see husband struggle with not being able to drive anymore, his increased smoking,  not understanding certain things as much, and less tolerance in some situations. Comments that it is helpful for her to be able to come for appts and talk through her stressors, fears, sadness, depression, anxieties, and concerns about husband's  gradual decline. Adult daughter with mental health and substance abuse issues, has recently made contact and comments with patient's sisters and with patient blaming patient. Appreciated coming for support and being able to vent family stressors, concerns about husband's Alzheimer's and considering decisions into the future, retirement issues, maintaining appropriate boundaries with daughter, unpacking more of her concerns about adult daughter's substance abuse and difficulty and lack of healthy decision-making and poor choices. Reports that journaling does help her and to bring in journal next session.  Interventions: Cognitive Behavioral Therapy, Solution-Oriented/Positive Psychology, and Ego-Supportive 1.Reduce overall level, frequency, and intensity of the anxiety so that daily functioning is not impaired. 2.Increase understanding of beliefs and messages that produce the worry and anxiety. 3.Identify, challenge, and replace anxious/fearful self-talk with positive, realistic, and empowering self-talk.   Diagnosis:   ICD-10-CM   1. Generalized anxiety disorder  F41.1      Plan:  Patient in session today and continuing her work on coping with husband's Alzheimer's which seems to be worsening some at times.  Still trying to set boundaries with her adult daughter who has mental/emotional challenges as well as substance abuse issues.  Emphasizing her own personal self-care with patient today and she is definitely aware of her continued need to take care of herself physically and emotionally.  She is very grateful for being able to come to sessions and share her concerns and work on strategies that can help her take care of herself in the midst of stressful circumstances with husband and adult daughter.  Does seem to be setting clearer boundaries as needed and stays in  touch with people who are supportive of her.  Not feeling as much hopelessness recently, as her anxiety, sadness, and worries about the  future do continue however is very consistent and working on these in session and outside of sessions.  Is grateful for the times that she can have better contact with husband despite his memory issues, is very thankful that they can participate in activities on some level and enjoy some time together even though he may not remember it the next day.  Goal review and progress/challenges noted with patient.  Next appointment within 2 weeks.   Barnie Bunde, LCSW

## 2024-09-19 ENCOUNTER — Ambulatory Visit (INDEPENDENT_AMBULATORY_CARE_PROVIDER_SITE_OTHER): Admitting: Psychiatry

## 2024-09-19 DIAGNOSIS — F411 Generalized anxiety disorder: Secondary | ICD-10-CM

## 2024-09-19 NOTE — Progress Notes (Signed)
 [facility] Counselor/Therapist Progress Note  Patient ID: [name], MRN: [id number],    Date: 09/19/2024  Time Spent: 55 minutes  Treatment Type: Individual Therapy  Reported Symptoms: anxiety, depression, not really feeling hopeless as that's improved, upcoming family contact at holidays that can be stressful     Mental Status Exam:  Appearance:   Casual and Neat     Behavior:  Appropriate, Sharing, and Motivated  Motor:  Normal  Speech/Language:   Clear and Coherent  Affect:  Anxious, some depression  Mood:  anxious and depressed  Thought process:  goal directed  Thought content:    WNL  Sensory/Perceptual disturbances:    WNL  Orientation:  oriented to person, place, time/date, situation, day of week, month of year, year, and stated date of Oct. 23, 2025  Attention:  Fair  Concentration:  Fair  Memory:  WNL  Fund of knowledge:   Good  Insight:    Good  Judgment:   Good  Impulse Control:  Good   Risk Assessment: Danger to Self:  No Self-injurious Behavior: No Danger to Others: No Duty to Warn:no Physical Aggression / Violence:No  Access to Firearms a concern: No  Gang Involvement:No   Subjective: Patient today reporting symptoms of anxiety, stress, less hopelessness now, anxiety related to family and personal challenges, stress and sadness over husband's continued decline with his dementia.  Husband's emotional/physical symptoms with his Alzheimers continues to progress. Difficulty seeing her husband struggle with his limits of not driving anymore, increased smoking, his lowered tolerance in certain situations, and his decreased understanding of things going on around him. Today, sharing more about her fears, sadness, depression, anxieties, and her husband's gradual decline.  Patient needing to talk about some family relationships including grandson (16, sx changes). ( Not all details included in this note due to patient privacy needs.) Patient sharing  how family situations have impacted her and others in the family, how she has tried to support young grandson over time, while also trying  to continue taking good care of herself emotionally and physically. Very appreciative of her time and the support she feels in coming for therapy sessions. Continues to work on the family stressors, retirement issues, having appropriate boundaries with adult daughter (substance abuse), concerns about husband's Alzheimers, and trying to use healthy decision-making in trying ot move forward in a more positive direction.  Interventions: Cognitive Behavioral Therapy, Solution-Oriented/Positive Psychology, and Ego-Supportive 1.Reduce overall level, frequency, and intensity of the anxiety so that daily functioning is not impaired. 2.Increase understanding of beliefs and messages that produce the worry and anxiety. 3.Identify, challenge, and replace anxious/fearful self-talk with positive, realistic, and empowering self-talk.    Diagnosis:   ICD-10-CM   1. Generalized anxiety disorder  F41.1      Plan:   Patient in session today and continues her work on coping with husband's Alzheimer's, concerns and being supportive of grandson (gender) and changes in progress.  (Not all details included in this note due to patient privacy needs.) continues to try and exercise healthy boundaries with adult daughter who continues to live with mental/emotional challenges as well as substance abuse issues.  Continue to emphasize patient's own personal care with her as she has a lot with which she is dealing with emotionally within the family.  Tries to set healthier boundaries and also stay in touch with people who are part of her support network.  Shares about the times that she does have better contact with  husband even though his memory issues are more pronounced, patient remains very thankful that they can still participate in conversation and activities on some level and enjoy that  time they do have together even though husband may not remember these times on the next day.  Patient continuing to work on setting clear and healthy boundaries as needed, remains in contact with people who are supportive of her, less hopelessness recently, trying to better manage her anxiety and some sadness, does worry about the future and is also working on this issue consistently in her therapy sessions.  Goal review and progress/challenges noted with patient.  Next appointment within 2 weeks.   Barnie Bunde, LCSW

## 2024-09-24 DIAGNOSIS — L820 Inflamed seborrheic keratosis: Secondary | ICD-10-CM | POA: Diagnosis not present

## 2024-09-24 DIAGNOSIS — L570 Actinic keratosis: Secondary | ICD-10-CM | POA: Diagnosis not present

## 2024-09-25 DIAGNOSIS — M810 Age-related osteoporosis without current pathological fracture: Secondary | ICD-10-CM | POA: Diagnosis not present

## 2024-09-26 ENCOUNTER — Ambulatory Visit: Admitting: Psychiatry

## 2024-09-26 DIAGNOSIS — F411 Generalized anxiety disorder: Secondary | ICD-10-CM | POA: Diagnosis not present

## 2024-09-26 NOTE — Progress Notes (Signed)
 [facility] Counselor/Therapist Progress Note  Patient ID: [name], MRN: [id number],    Date: 09/26/2024  Time Spent: 55 minutes   Treatment Type: Individual Therapy  Reported Symptoms: anxiety, depression, less hopeless    Mental Status Exam:  Appearance:   Casual and Neat     Behavior:  Appropriate, Sharing, and Motivated  Motor:  Normal  Speech/Language:   Clear and Coherent  Affect:  Depressed, anxious  Mood:  anxious and depressed  Thought process:  goal directed  Thought content:    WNL  Sensory/Perceptual disturbances:    WNL  Orientation:  oriented to person, place, time/date, situation, day of week, month of year, year, and stated date of Oct. 30, 2025  Attention:  Good  Concentration:  Good  Memory:  WNL  Fund of knowledge:   Good  Insight:    Good  Judgment:   Good  Impulse Control:  Good   Risk Assessment: Danger to Self:  No Self-injurious Behavior: No Danger to Others: No Duty to Warn:no Physical Aggression / Violence:No  Access to Firearms a concern: No  Gang Involvement:No   Subjective:  Patient in session today reporting symptoms of anxiety, some depression, stress, less hopelessness. Symptoms related to family/personal challenges, stress and sadness re: husband's continued decline with dementia. Sharing stressful mixed feelings re: family situations and issues re: adult daughter and some extended family. Processing more of her grief and sadness in seeing husband's decline with his dementia. Encouraging her more recent thinking through some family issues that would help make matters easier for her and husband during upcoming Thanksgiving holidays. Issues with some extended family that she needed to talk through more today, which she did, and seemed to reach some better decision-making that would be better for her and husband in light of multiple changes in recent weeks.  Her husband's Alzheimer's does continue to progress with some further  noticeable emotional/physical symptoms happening recently.  Continues to support her husband not driving anymore due to his emotional and physical status however this is hard for patient to see his disappointment and not really understand why he needs to hold off driving at this point.  Little tearfulness today, and showing more strength and some family related issues and decisions.  Talking further about situation with young grandson age 36.  (Not all details included in this note due to patient privacy needs.) patient continues to work on having appropriate boundaries with her adult daughter (substance abuse), concerns with her husband's Alzheimer's, retirement issues, family stressors, and continues to use healthy decision making as she has to make certain decisions now and in envisioning the future for her and her husband.  Interventions: Cognitive Behavioral Therapy, Solution-Oriented/Positive Psychology, and Ego-Supportive 1.Reduce overall level, frequency, and intensity of the anxiety so that daily functioning is not impaired. 2.Increase understanding of beliefs and messages that produce the worry and anxiety. 3.Identify, challenge, and replace anxious/fearful self-talk with positive, realistic, and empowering self-talk.    Diagnosis:   ICD-10-CM   1. Generalized anxiety disorder  F41.1      Plan: Patient working in session today on her coping with multiple stressors of husband's Alzheimers, concerns and support of grandson (gender), her own health, and her adult daughter's mental health issues.  Heard from relative in [place], [place] and that was supportive for patient.  Continues to take good care of herself as well as her husband who is more reliant on her, and the impact of his Alzheimer's increases  gradually.  Finds use of healthier boundaries very helpful.  Memory issues of husband are more pronounced and she notices them more often but typically not calling attention to it.  Less  hopelessness, better managing anxiety and sadness at times and other times can be up and down, not worried quite as much as she knows they are trying to prepare in advance for more changes eventually in health and where they will be living.  Goal review and progress/challenges noted with patient.  Next appointment within 2 weeks.   Barnie Bunde, LCSW

## 2024-10-14 ENCOUNTER — Ambulatory Visit: Admitting: Psychiatry

## 2024-10-15 ENCOUNTER — Ambulatory Visit: Admitting: Psychiatry

## 2024-10-21 ENCOUNTER — Ambulatory Visit (INDEPENDENT_AMBULATORY_CARE_PROVIDER_SITE_OTHER): Admitting: Psychiatry

## 2024-10-21 DIAGNOSIS — F411 Generalized anxiety disorder: Secondary | ICD-10-CM

## 2024-10-21 NOTE — Progress Notes (Signed)
 [facility] Counselor/Therapist Progress Note  Patient ID: [name], MRN: [id number],    Date: 10/21/2024  Time Spent: 55 minutes   Treatment Type: Individual Therapy  Reported Symptoms:  anxious, less  hopeless, some depression   Mental Status Exam:  Appearance:   Casual and Neat     Behavior:  Appropriate, Sharing, and Motivated  Motor:  Normal  Speech/Language:   Clear and Coherent  Affect:  Anxious, some depression and stressed  Mood:  anxious and stressed  Thought process:  goal directed  Thought content:    WNL  Sensory/Perceptual disturbances:    WNL  Orientation:  oriented to person, place, time/date, situation, day of week, month of year, year, and stated date of 31. 24, 2025  Attention:  Good  Concentration:  Good  Memory:  Some occasional forgetting  Fund of knowledge:   Good  Insight:    Good  Judgment:   Good  Impulse Control:  Good   Risk Assessment: Danger to Self:  No Self-injurious Behavior: No Danger to Others: No Duty to Warn:no Physical Aggression / Violence:No  Access to Firearms a concern: No  Gang Involvement:No   Subjective: Patient working in session today on her anxiety, stress, less hopelessness, and some depression. Husband continues to have dementia that is worsening some. Sharing more incidents of husband's alzheimer's that has been difficult for patient to watch husband's health and mental health especially decline. States it is helpful for her to have her time here to share her concerns and work through them. Processing more or her mixed thoughts and feelings regarding her husband's Alzheimers and in pacing herself as they recently found out they'll be able to move in Feb./March 2026.  Adult daughter more quiet recently and no acting out reported.  Looking forward to the Thanksgiving holiday and hope that all goes well.  Noticing some continued progression in husband's Alzheimer's and continues to support him however needed.   Is not allowing him to drive anymore which is a good decision they were able to come to.  No tearfulness today.  Showing really good strength and some clear thought through decision making.  Looking forward to following through on her plan to move to a retirement center that includes a level of care for her husband as his Alzheimer's continues.  Anxious about this however is optimistic that this will be a good situation for them, just has a lot of things to do and take care of before they are ready to move.  Does have some good help within the family.  Interventions: Cognitive Behavioral Therapy, Solution-Oriented/Positive Psychology, and Ego-Supportive 1.Reduce overall level, frequency, and intensity of the anxiety so that daily functioning is not impaired. 2.Increase understanding of beliefs and messages that produce the worry and anxiety. 3.Identify, challenge, and replace anxious/fearful self-talk with positive, realistic, and empowering self-talk.  Diagnosis:   ICD-10-CM   1. Generalized anxiety disorder  F41.1      Plan:  Patient today working further on her coping more effectively with her husband's Alzheimer's and the stressors and concerns that she has in supporting him.  Also some issues of wanting to support her grandson (gender), her own health and have appropriate boundaries for a daughter who struggles with mental health issues.  Notices some progression of her husband and his memory issues and he is no longer driving at this point.  Patient requiring more acceptance and not as hopeless feeling today.  Is feeling some excitement and  hope for their eventual move into a retirement home setting that also has a level of care that can accommodate her husband as his Alzheimer's progresses.  Goal review and progress/challenges noted with patient.  Next appointment within 2 weeks.   Barnie Bunde, LCSW

## 2024-10-29 ENCOUNTER — Ambulatory Visit: Admitting: Psychiatry

## 2024-10-29 DIAGNOSIS — F411 Generalized anxiety disorder: Secondary | ICD-10-CM | POA: Diagnosis not present

## 2024-10-29 NOTE — Progress Notes (Signed)
 [facility] Counselor/Therapist Progress Note  Patient ID: [name], MRN: [id number],    Date: 10/29/2024  Time Spent: 55 minutes   Treatment Type: Individual Therapy  Reported Symptoms:  anxious, less hopeless, some depression   Mental Status Exam:  Appearance:   Neat and Well Groomed     Behavior:  Appropriate, Sharing, and Motivated  Motor:  Normal  Speech/Language:   Clear and Coherent  Affect:  Appropriate  Mood:  anxious  Thought process:  goal directed  Thought content:    WNL  Sensory/Perceptual disturbances:    WNL  Orientation:  oriented to person, place, time/date, situation, day of week, month of year, year, and stated date of Dec. 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  Very aware of some of husband's increasing forgetfulness. Planning to take a trip soon and will likely be a last time when we can travel away very far. Patient sharing some updated concerns re: husband and his health, physical and mental. Explaining how it is difficult for her watching some of her husband's decline, but also aware of some of the ways he still relates relatively well at times within the family. Difficult to watch her husband decline and trying to make the best of times for them as she is able. Appreciates coming to therapy to work on specific goals and feels she is making progress. Planning to move within the next approximately 3-4 months to a long term care environment where they can both live in a cottage on campus, get their meals and other  entertainment onsite, and have medical and other services available to them, including suitable care for her husband as his alzheimers may worsen. Reports having good Thanksgiving although husband's awareness was not particularly good that day, and he also has some days that are better than others. Husband no longer driving. Patient clear in her decision-making for their upcoming move and shared more about their plans moving forward and how she sees that as a positive thing for them.    Interventions: Cognitive Behavioral Therapy, Solution-Oriented/Positive Psychology, and Ego-Supportive 1.Reduce overall level, frequency, and intensity of the anxiety so that daily functioning is not impaired. 2.Increase understanding of beliefs and messages that produce the worry and anxiety. 3.Identify, challenge, and replace anxious/fearful self-talk with positive, realistic, and empowering self-talk.   Diagnosis:   ICD-10-CM   1. Generalized anxiety disorder  F41.1      Plan:  Patient today in session, and mood more elevated as she is working on planning  her and her husband's upcoming move to [facility], (long-time retirement home with multiple care levels). Some progression of husband's alzheimers noticed gradually.  Also concerned about a grandson and some gender issues.  (Not all details included in this note due to patient privacy needs.) Patient seems to be feeling more hopeful today and trying to stay calm and her planning for she and her husband's upcoming move to a retirement home setting that will provide better for her husband as his Alzheimer's progresses  and can accommodate both of their needs.  Goal review and progress/challenges noted with patient.  Next appointment within 2 weeks.   Barnie Bunde, LCSW

## 2024-11-01 DIAGNOSIS — N184 Chronic kidney disease, stage 4 (severe): Secondary | ICD-10-CM | POA: Diagnosis not present

## 2024-11-05 DIAGNOSIS — D649 Anemia, unspecified: Secondary | ICD-10-CM | POA: Diagnosis not present

## 2024-11-05 DIAGNOSIS — N184 Chronic kidney disease, stage 4 (severe): Secondary | ICD-10-CM | POA: Diagnosis not present

## 2024-11-05 DIAGNOSIS — I129 Hypertensive chronic kidney disease with stage 1 through stage 4 chronic kidney disease, or unspecified chronic kidney disease: Secondary | ICD-10-CM | POA: Diagnosis not present

## 2024-11-05 DIAGNOSIS — E1122 Type 2 diabetes mellitus with diabetic chronic kidney disease: Secondary | ICD-10-CM | POA: Diagnosis not present

## 2024-11-12 ENCOUNTER — Ambulatory Visit: Admitting: Psychiatry

## 2024-11-12 DIAGNOSIS — F411 Generalized anxiety disorder: Secondary | ICD-10-CM

## 2024-11-12 NOTE — Progress Notes (Signed)
 [facility] Counselor/Therapist Progress Note  Patient ID: [name], MRN: [id number],    Date: 11/12/2024  Time Spent: 53 minutes   Treatment Type: Individual Therapy  Reported Symptoms: Anxious, some hopelessness but improving, some depression   Mental Status Exam:  Appearance:   Casual and Neat     Behavior:  Appropriate, Sharing, and Motivated  Motor:  Normal  Speech/Language:   Clear and Coherent  Affect:  Depressed and anxiety  Mood:  anxious and depressed  Thought process:  normal  Thought content:    WNL  Sensory/Perceptual disturbances:    WNL  Orientation:  oriented to person, place, time/date, situation, day of week, month of year, year, and stated date of Dec. 16, 2025  Attention:  Good  Concentration:  Good  Memory:  WNL  Fund of knowledge:   Good  Insight:    Good  Judgment:   Good  Impulse Control:  Good   Risk Assessment: Danger to Self:  No Self-injurious Behavior: No Danger to Others: No Duty to Warn:no Physical Aggression / Violence:No  Access to Firearms a concern: No  Gang Involvement:No   Subjective:   Patient working well in session today further on her anxiety, stress, less hopelessness, and some depression. Husband's decline emotionally is difficult for patient and she processed this more in session today.  Also shared and processed that she and husband had a dear longtime friend that died this past week, and talked this through in session also.  Is working more on the upcoming move to a local retirement community where patient and husband will be moving within the next 3 months. States she is feeling more optimistic at times about she and husband and their plans going forward.   Still struggling with some anxiety and depression regarding some family issues and particularly with her adult daughter.  Patient also feels that her husband's Alzheimer's has worsened some although not suddenly but definitely gradually.  Not all details included  in this note due to patient privacy needs.  Does feel that their upcoming move to a retirement community that offers different levels of care, will be a big help to them as husband's Alzheimer's progresses.  Some sadness which patient shared today but also trying to focus on some positive Christmas plans within the family and friends.     Interventions: Cognitive Behavioral Therapy, Ego-Supportive, and Insight-Oriented 1.Reduce overall level, frequency, and intensity of the anxiety so that daily functioning is not impaired. 2.Increase understanding of beliefs and messages that produce the worry and anxiety. 3.Identify, challenge, and replace anxious/fearful self-talk with positive, realistic, and empowering self-talk.    Diagnosis:   ICD-10-CM   1. Generalized anxiety disorder  F41.1      Plan:     Patient working today on her stress, anxiety, and some depression as she works on current issues within the family in particular her husband whose Alzheimer's is worsening some gradually.  As noted above, their plan is to move to a long-term care facility which offers various levels of care including independent living, specialized care for Alzheimer's and memory care patients, and others.  Patient seems to be looking forward more to that eventual move which should happen in the next 2 to 3 months and they will initially going to a cottage on the campus of this facility which is part of independent living and eventually when needed husband will be able to be transferred on campus to a unit focusing more on  Alzheimer's care and wife will be able to remain in an independent living apartment.  While they and family feel this will be a good move for them, there is some considerable stress involved until they get settled.  Patient's mindset at this point is that it will help she and her husband to get moved and and settled at a place where even when he needs a greater level of care, they can still be close  together on the same campus and accommodate both of their needs.  Review of stress management strategies with patient today.  Goal review and progress/challenges noted with patient.  Next appointment within 2 weeks.   Barnie Bunde, LCSW

## 2024-12-04 ENCOUNTER — Ambulatory Visit: Admitting: Psychiatry

## 2024-12-04 DIAGNOSIS — F411 Generalized anxiety disorder: Secondary | ICD-10-CM | POA: Diagnosis not present

## 2024-12-04 NOTE — Progress Notes (Signed)
 "       [facility] Counselor/Therapist Progress Note  Patient ID: [name], MRN: [id number],    Date: 12/04/2024  Time Spent: 55 minutes   Treatment Type: Individual Therapy  Reported Symptoms: anxious, depression, sad re: her long-time doghopelessness definitely improving   Mental Status Exam:  Appearance:   Neat and Well Groomed     Behavior:  Appropriate, Sharing, and Motivated  Motor:  Normal  Speech/Language:   Clear and Coherent  Affect:  Anxious, some depression  Mood:  anxious and depressed  Thought process:  goal directed  Thought content:    WNL  Sensory/Perceptual disturbances:    WNL  Orientation:  oriented to person, place, time/date, situation, day of week, month of year, year, and stated date of Jan. 7, 2026  Attention:  Good  Concentration:  Good  Memory:  WNL  Fund of knowledge:   Good  Insight:    Good  Judgment:   Good  Impulse Control:  Good   Risk Assessment: Danger to Self:  No Self-injurious Behavior: No Danger to Others: No Duty to Warn:no Physical Aggression / Violence:No  Access to Firearms a concern: No  Gang Involvement:No   Subjective:  Patient working further today in session on her anxiety, stress, some depression, and less hopelessness.  Her husband's increasing symptoms of Alzheimer's and his emotional decline continues to be quite difficult for patient and we worked further on this in session today. Planning to move into a retirement facility in the Spring of this year. Dealing with significant stress re home and prepping in a  few weeks to move later into an adult retirement community. Stressful but feels that will be a good move for she and husband as the community has services that will be very helpful for them. Discussed and processed stress management skills which she feels will be helpful now and into the future as they anticipate and prepare for their upcoming move. Managing grief better re: loss of long time friend recently.    Interventions: Cognitive Behavioral Therapy, Solution-Oriented/Positive Psychology, and Ego-Supportive 1.Reduce overall level, frequency, and intensity of the anxiety so that daily functioning is not impaired. 2.Increase understanding of beliefs and messages that produce the worry and anxiety. 3.Identify, challenge, and replace anxious/fearful self-talk with positive, realistic, and empowering self-talk.    Diagnosis:   ICD-10-CM   1. Generalized anxiety disorder  F41.1      Plan:   Patient today in session today working further on her anxiety, depression, stress, and some family challenges. Husband's alzheimer's is worsening gradually.  To continue working on her treatment interventions especially as we discussed her managing her anxiety and some sadness regarding her dog's decline but is still hopeful that the veterinarian will be able to help some more at this point.  Also working on her sadness and anxiety regarding husband's decline with his Alzheimer's but at this point is still able to live with her, as they anticipate moving soon into a senior adult community that includes facilities that help care for patients such as her husband with Alzheimer's when they need to move into a different level of care other than independent living.  Patient does seem to be looking forward to the move although knows it would be a lot of work and has some good help.  This will provide them to have a greater level of care available to help them as needed and they will be on campus with other senior adults and have a  lot of opportunities for socialization and good care.  Goal review and progress/challenges noted with patient.  Next appointment within 2 weeks.   Barnie Bunde, LCSW                   "

## 2024-12-11 ENCOUNTER — Ambulatory Visit (INDEPENDENT_AMBULATORY_CARE_PROVIDER_SITE_OTHER): Admitting: Psychiatry

## 2024-12-11 DIAGNOSIS — F411 Generalized anxiety disorder: Secondary | ICD-10-CM

## 2024-12-11 NOTE — Progress Notes (Signed)
 "       [facility] Counselor/Therapist Progress Note  Patient ID: [name], MRN: [id number],    Date: 12/11/2024  Time Spent: 55 minutes   Treatment Type: Individual Therapy  Reported Symptoms:  anxious, depression, concerned about her aging dog   Mental Status Exam:  Appearance:   Casual and Neat     Behavior:  Appropriate, Sharing, and Motivated  Motor:  Normal  Speech/Language:   Clear and Coherent  Affect:  Depressed and depression  Mood:  anxious and depressed  Thought process:  goal directed  Thought content:    WNL  Sensory/Perceptual disturbances:    WNL  Orientation:  oriented to person, place, time/date, situation, day of week, month of year, year, and stated date of Jan. 14, 2026  Attention:  Good  Concentration:  Good  Memory:  WNL  Fund of knowledge:   Good  Insight:    Good  Judgment:   Good  Impulse Control:  Good   Risk Assessment: Danger to Self:  No Self-injurious Behavior: No Danger to Others: No Duty to Warn:no Physical Aggression / Violence:No  Access to Firearms a concern: No  Gang Involvement:No   Subjective:   Patient in session today reporting anxiety, depression, stress, hopelessness fluctuates, and their older dog's health is improving after some stressful times recently. Quite sad about her dog and processed her sadness more in session today. Found out more concrete info on their upcoming move to a retirement community and will likely move there in mid-March. Working to better manage her stress and anxiety and give herself some more breaks throughout the day.  Noticing better emotional self-care with patient and less times of being self-critical.  Working on patience with her husband which can be challenging at times understandably with his memory issues getting some worse, and this is a stressful time for them as they plan to be moving into a retirement center.  While patient and family feels that this will be a good move for she and her  husband, there is also a good bit of stress involved and patient is working to better manage that and to have more realistic expectations of herself and not overwhelm herself.  Is staying connected with friends which is helpful and also extended family that are supportive.  Continues to set healthy boundaries with adult daughter as needed, and so far daughter seems to be respecting those boundaries better than in the past.  Less hopelessness.  The increase in Alzheimer symptoms with her husband is helping patient feel that she is making a good decision for them to go into a retirement facility that we will have different levels of care whenever her husband is needing something more intense.  Working further on stress management as the upcoming move to a new home can be stressful.  She does have some support within her family and friends but also the facility itself is good about helping people as they move in and get adjusted.   Interventions: Cognitive Behavioral Therapy, Solution-Oriented/Positive Psychology, and Ego-Supportive 1.Reduce overall level, frequency, and intensity of the anxiety so that daily functioning is not impaired. 2.Increase understanding of beliefs and messages that produce the worry and anxiety. 3.Identify, challenge, and replace anxious/fearful self-talk with positive, realistic, and empowering self-talk.  Diagnosis:   ICD-10-CM   1. Generalized anxiety disorder  F41.1      Plan: Patient today continues to work on her anxiety, some depression, stress, and family challenges.  As noted above  they are preparing to move within the next couple months to a longer term care community where there is various levels of care and they will be moving in to their new home they are in independent living, but the option is they are on the same campus for Alzheimer's care at what ever point that might become needed.  This is reassuring for patient and her husband however also stressful.  Also  having some sadness about one of their 2 dogs who is aging and having some health issues, saw the vet recently and is definitely having some challenges.   Goal review and progress/challenges noted with patient.  Next appointment within 2 weeks.   Barnie Bunde, LCSW                   "

## 2024-12-25 ENCOUNTER — Ambulatory Visit: Admitting: Psychiatry

## 2025-01-01 ENCOUNTER — Ambulatory Visit: Admitting: Psychiatry

## 2025-01-09 ENCOUNTER — Ambulatory Visit: Admitting: Psychiatry
